# Patient Record
Sex: Female | Born: 1935 | Hispanic: No | Marital: Single | Smoking: Never smoker
Health system: Southern US, Community
[De-identification: ages and names within clinical notes are randomized; demographics above are authoritative.]

## PROBLEM LIST (undated history)

## (undated) DIAGNOSIS — H3530 Unspecified macular degeneration: Secondary | ICD-10-CM

## (undated) DIAGNOSIS — I4891 Unspecified atrial fibrillation: Secondary | ICD-10-CM

## (undated) DIAGNOSIS — C801 Malignant (primary) neoplasm, unspecified: Secondary | ICD-10-CM

## (undated) DIAGNOSIS — I10 Essential (primary) hypertension: Secondary | ICD-10-CM

## (undated) HISTORY — PX: CATARACT EXTRACTION W/PHACO: SHX586

## (undated) HISTORY — DX: Unspecified atrial fibrillation: I48.91

## (undated) HISTORY — DX: Essential (primary) hypertension: I10

## (undated) HISTORY — DX: Unspecified macular degeneration: H35.30

## (undated) HISTORY — DX: Malignant (primary) neoplasm, unspecified: C80.1

---

## 2000-12-30 ENCOUNTER — Other Ambulatory Visit: Admission: RE | Admit: 2000-12-30 | Discharge: 2000-12-30 | Payer: Self-pay | Admitting: Dermatology

## 2015-05-09 DIAGNOSIS — C44219 Basal cell carcinoma of skin of left ear and external auricular canal: Secondary | ICD-10-CM | POA: Diagnosis not present

## 2015-05-09 DIAGNOSIS — L570 Actinic keratosis: Secondary | ICD-10-CM | POA: Diagnosis not present

## 2015-05-09 DIAGNOSIS — D485 Neoplasm of uncertain behavior of skin: Secondary | ICD-10-CM | POA: Diagnosis not present

## 2015-05-23 DIAGNOSIS — C44219 Basal cell carcinoma of skin of left ear and external auricular canal: Secondary | ICD-10-CM | POA: Diagnosis not present

## 2015-06-03 DIAGNOSIS — H353211 Exudative age-related macular degeneration, right eye, with active choroidal neovascularization: Secondary | ICD-10-CM | POA: Diagnosis not present

## 2015-07-04 DIAGNOSIS — Z6826 Body mass index (BMI) 26.0-26.9, adult: Secondary | ICD-10-CM | POA: Diagnosis not present

## 2015-07-04 DIAGNOSIS — M109 Gout, unspecified: Secondary | ICD-10-CM | POA: Diagnosis not present

## 2015-07-04 DIAGNOSIS — E039 Hypothyroidism, unspecified: Secondary | ICD-10-CM | POA: Diagnosis not present

## 2015-07-04 DIAGNOSIS — M1990 Unspecified osteoarthritis, unspecified site: Secondary | ICD-10-CM | POA: Diagnosis not present

## 2015-07-04 DIAGNOSIS — I10 Essential (primary) hypertension: Secondary | ICD-10-CM | POA: Diagnosis not present

## 2015-07-04 DIAGNOSIS — Z789 Other specified health status: Secondary | ICD-10-CM | POA: Diagnosis not present

## 2015-08-06 DIAGNOSIS — H353211 Exudative age-related macular degeneration, right eye, with active choroidal neovascularization: Secondary | ICD-10-CM | POA: Diagnosis not present

## 2015-08-06 DIAGNOSIS — H353221 Exudative age-related macular degeneration, left eye, with active choroidal neovascularization: Secondary | ICD-10-CM | POA: Diagnosis not present

## 2015-10-03 DIAGNOSIS — H353221 Exudative age-related macular degeneration, left eye, with active choroidal neovascularization: Secondary | ICD-10-CM | POA: Diagnosis not present

## 2015-10-03 DIAGNOSIS — H353211 Exudative age-related macular degeneration, right eye, with active choroidal neovascularization: Secondary | ICD-10-CM | POA: Diagnosis not present

## 2015-10-22 DIAGNOSIS — Z85828 Personal history of other malignant neoplasm of skin: Secondary | ICD-10-CM | POA: Diagnosis not present

## 2015-10-22 DIAGNOSIS — D485 Neoplasm of uncertain behavior of skin: Secondary | ICD-10-CM | POA: Diagnosis not present

## 2015-10-22 DIAGNOSIS — L820 Inflamed seborrheic keratosis: Secondary | ICD-10-CM | POA: Diagnosis not present

## 2015-10-22 DIAGNOSIS — D0439 Carcinoma in situ of skin of other parts of face: Secondary | ICD-10-CM | POA: Diagnosis not present

## 2015-10-22 DIAGNOSIS — L570 Actinic keratosis: Secondary | ICD-10-CM | POA: Diagnosis not present

## 2015-11-06 DIAGNOSIS — E7800 Pure hypercholesterolemia, unspecified: Secondary | ICD-10-CM | POA: Diagnosis not present

## 2015-11-06 DIAGNOSIS — M109 Gout, unspecified: Secondary | ICD-10-CM | POA: Diagnosis not present

## 2015-11-06 DIAGNOSIS — Z299 Encounter for prophylactic measures, unspecified: Secondary | ICD-10-CM | POA: Diagnosis not present

## 2015-11-06 DIAGNOSIS — I10 Essential (primary) hypertension: Secondary | ICD-10-CM | POA: Diagnosis not present

## 2015-11-28 DIAGNOSIS — C44129 Squamous cell carcinoma of skin of left eyelid, including canthus: Secondary | ICD-10-CM | POA: Diagnosis not present

## 2015-12-05 DIAGNOSIS — H353211 Exudative age-related macular degeneration, right eye, with active choroidal neovascularization: Secondary | ICD-10-CM | POA: Diagnosis not present

## 2015-12-05 DIAGNOSIS — H35051 Retinal neovascularization, unspecified, right eye: Secondary | ICD-10-CM | POA: Diagnosis not present

## 2015-12-17 DIAGNOSIS — M159 Polyosteoarthritis, unspecified: Secondary | ICD-10-CM | POA: Diagnosis not present

## 2015-12-17 DIAGNOSIS — I10 Essential (primary) hypertension: Secondary | ICD-10-CM | POA: Diagnosis not present

## 2016-01-15 DIAGNOSIS — M159 Polyosteoarthritis, unspecified: Secondary | ICD-10-CM | POA: Diagnosis not present

## 2016-01-15 DIAGNOSIS — I10 Essential (primary) hypertension: Secondary | ICD-10-CM | POA: Diagnosis not present

## 2016-01-21 DIAGNOSIS — E039 Hypothyroidism, unspecified: Secondary | ICD-10-CM | POA: Diagnosis not present

## 2016-01-21 DIAGNOSIS — Z1211 Encounter for screening for malignant neoplasm of colon: Secondary | ICD-10-CM | POA: Diagnosis not present

## 2016-01-21 DIAGNOSIS — E7800 Pure hypercholesterolemia, unspecified: Secondary | ICD-10-CM | POA: Diagnosis not present

## 2016-01-21 DIAGNOSIS — E559 Vitamin D deficiency, unspecified: Secondary | ICD-10-CM | POA: Diagnosis not present

## 2016-01-21 DIAGNOSIS — Z0000 Encounter for general adult medical examination without abnormal findings: Secondary | ICD-10-CM | POA: Diagnosis not present

## 2016-01-21 DIAGNOSIS — R5383 Other fatigue: Secondary | ICD-10-CM | POA: Diagnosis not present

## 2016-01-21 DIAGNOSIS — Z79899 Other long term (current) drug therapy: Secondary | ICD-10-CM | POA: Diagnosis not present

## 2016-01-21 DIAGNOSIS — Z1389 Encounter for screening for other disorder: Secondary | ICD-10-CM | POA: Diagnosis not present

## 2016-01-21 DIAGNOSIS — Z299 Encounter for prophylactic measures, unspecified: Secondary | ICD-10-CM | POA: Diagnosis not present

## 2016-01-21 DIAGNOSIS — Z7189 Other specified counseling: Secondary | ICD-10-CM | POA: Diagnosis not present

## 2016-01-21 DIAGNOSIS — Z6826 Body mass index (BMI) 26.0-26.9, adult: Secondary | ICD-10-CM | POA: Diagnosis not present

## 2016-01-28 DIAGNOSIS — H353211 Exudative age-related macular degeneration, right eye, with active choroidal neovascularization: Secondary | ICD-10-CM | POA: Diagnosis not present

## 2016-02-04 DIAGNOSIS — I10 Essential (primary) hypertension: Secondary | ICD-10-CM | POA: Diagnosis not present

## 2016-02-04 DIAGNOSIS — M159 Polyosteoarthritis, unspecified: Secondary | ICD-10-CM | POA: Diagnosis not present

## 2016-02-25 DIAGNOSIS — Z6826 Body mass index (BMI) 26.0-26.9, adult: Secondary | ICD-10-CM | POA: Diagnosis not present

## 2016-02-25 DIAGNOSIS — Z23 Encounter for immunization: Secondary | ICD-10-CM | POA: Diagnosis not present

## 2016-02-25 DIAGNOSIS — Z713 Dietary counseling and surveillance: Secondary | ICD-10-CM | POA: Diagnosis not present

## 2016-02-25 DIAGNOSIS — J309 Allergic rhinitis, unspecified: Secondary | ICD-10-CM | POA: Diagnosis not present

## 2016-02-25 DIAGNOSIS — Z299 Encounter for prophylactic measures, unspecified: Secondary | ICD-10-CM | POA: Diagnosis not present

## 2016-02-25 DIAGNOSIS — I10 Essential (primary) hypertension: Secondary | ICD-10-CM | POA: Diagnosis not present

## 2016-03-10 DIAGNOSIS — E2839 Other primary ovarian failure: Secondary | ICD-10-CM | POA: Diagnosis not present

## 2016-03-17 DIAGNOSIS — M159 Polyosteoarthritis, unspecified: Secondary | ICD-10-CM | POA: Diagnosis not present

## 2016-03-17 DIAGNOSIS — I10 Essential (primary) hypertension: Secondary | ICD-10-CM | POA: Diagnosis not present

## 2016-03-31 DIAGNOSIS — H353211 Exudative age-related macular degeneration, right eye, with active choroidal neovascularization: Secondary | ICD-10-CM | POA: Diagnosis not present

## 2016-05-12 DIAGNOSIS — M159 Polyosteoarthritis, unspecified: Secondary | ICD-10-CM | POA: Diagnosis not present

## 2016-05-12 DIAGNOSIS — I10 Essential (primary) hypertension: Secondary | ICD-10-CM | POA: Diagnosis not present

## 2016-05-13 DIAGNOSIS — Z85828 Personal history of other malignant neoplasm of skin: Secondary | ICD-10-CM | POA: Diagnosis not present

## 2016-05-13 DIAGNOSIS — L570 Actinic keratosis: Secondary | ICD-10-CM | POA: Diagnosis not present

## 2016-05-13 DIAGNOSIS — D485 Neoplasm of uncertain behavior of skin: Secondary | ICD-10-CM | POA: Diagnosis not present

## 2016-06-02 DIAGNOSIS — H353211 Exudative age-related macular degeneration, right eye, with active choroidal neovascularization: Secondary | ICD-10-CM | POA: Diagnosis not present

## 2016-06-08 DIAGNOSIS — I10 Essential (primary) hypertension: Secondary | ICD-10-CM | POA: Diagnosis not present

## 2016-06-08 DIAGNOSIS — M159 Polyosteoarthritis, unspecified: Secondary | ICD-10-CM | POA: Diagnosis not present

## 2016-06-18 DIAGNOSIS — H353122 Nonexudative age-related macular degeneration, left eye, intermediate dry stage: Secondary | ICD-10-CM | POA: Diagnosis not present

## 2016-06-18 DIAGNOSIS — H35033 Hypertensive retinopathy, bilateral: Secondary | ICD-10-CM | POA: Diagnosis not present

## 2016-06-18 DIAGNOSIS — Z961 Presence of intraocular lens: Secondary | ICD-10-CM | POA: Diagnosis not present

## 2016-06-18 DIAGNOSIS — H353211 Exudative age-related macular degeneration, right eye, with active choroidal neovascularization: Secondary | ICD-10-CM | POA: Diagnosis not present

## 2016-06-30 DIAGNOSIS — R252 Cramp and spasm: Secondary | ICD-10-CM | POA: Diagnosis not present

## 2016-06-30 DIAGNOSIS — Z789 Other specified health status: Secondary | ICD-10-CM | POA: Diagnosis not present

## 2016-06-30 DIAGNOSIS — E7800 Pure hypercholesterolemia, unspecified: Secondary | ICD-10-CM | POA: Diagnosis not present

## 2016-06-30 DIAGNOSIS — Z299 Encounter for prophylactic measures, unspecified: Secondary | ICD-10-CM | POA: Diagnosis not present

## 2016-06-30 DIAGNOSIS — Z6824 Body mass index (BMI) 24.0-24.9, adult: Secondary | ICD-10-CM | POA: Diagnosis not present

## 2016-06-30 DIAGNOSIS — I10 Essential (primary) hypertension: Secondary | ICD-10-CM | POA: Diagnosis not present

## 2016-06-30 DIAGNOSIS — Z713 Dietary counseling and surveillance: Secondary | ICD-10-CM | POA: Diagnosis not present

## 2016-07-01 DIAGNOSIS — E7800 Pure hypercholesterolemia, unspecified: Secondary | ICD-10-CM | POA: Diagnosis not present

## 2016-08-18 DIAGNOSIS — H35362 Drusen (degenerative) of macula, left eye: Secondary | ICD-10-CM | POA: Diagnosis not present

## 2016-08-18 DIAGNOSIS — M159 Polyosteoarthritis, unspecified: Secondary | ICD-10-CM | POA: Diagnosis not present

## 2016-08-18 DIAGNOSIS — H353211 Exudative age-related macular degeneration, right eye, with active choroidal neovascularization: Secondary | ICD-10-CM | POA: Diagnosis not present

## 2016-08-18 DIAGNOSIS — H43812 Vitreous degeneration, left eye: Secondary | ICD-10-CM | POA: Diagnosis not present

## 2016-08-18 DIAGNOSIS — I10 Essential (primary) hypertension: Secondary | ICD-10-CM | POA: Diagnosis not present

## 2016-08-18 DIAGNOSIS — H353221 Exudative age-related macular degeneration, left eye, with active choroidal neovascularization: Secondary | ICD-10-CM | POA: Diagnosis not present

## 2016-08-18 DIAGNOSIS — H35051 Retinal neovascularization, unspecified, right eye: Secondary | ICD-10-CM | POA: Diagnosis not present

## 2016-09-04 DIAGNOSIS — M159 Polyosteoarthritis, unspecified: Secondary | ICD-10-CM | POA: Diagnosis not present

## 2016-09-04 DIAGNOSIS — I10 Essential (primary) hypertension: Secondary | ICD-10-CM | POA: Diagnosis not present

## 2016-09-29 DIAGNOSIS — Z6825 Body mass index (BMI) 25.0-25.9, adult: Secondary | ICD-10-CM | POA: Diagnosis not present

## 2016-09-29 DIAGNOSIS — Z713 Dietary counseling and surveillance: Secondary | ICD-10-CM | POA: Diagnosis not present

## 2016-09-29 DIAGNOSIS — E039 Hypothyroidism, unspecified: Secondary | ICD-10-CM | POA: Diagnosis not present

## 2016-09-29 DIAGNOSIS — Z299 Encounter for prophylactic measures, unspecified: Secondary | ICD-10-CM | POA: Diagnosis not present

## 2016-09-29 DIAGNOSIS — E7800 Pure hypercholesterolemia, unspecified: Secondary | ICD-10-CM | POA: Diagnosis not present

## 2016-09-29 DIAGNOSIS — H3530 Unspecified macular degeneration: Secondary | ICD-10-CM | POA: Diagnosis not present

## 2016-09-29 DIAGNOSIS — M109 Gout, unspecified: Secondary | ICD-10-CM | POA: Diagnosis not present

## 2016-09-29 DIAGNOSIS — I10 Essential (primary) hypertension: Secondary | ICD-10-CM | POA: Diagnosis not present

## 2016-10-13 DIAGNOSIS — H905 Unspecified sensorineural hearing loss: Secondary | ICD-10-CM | POA: Diagnosis not present

## 2016-11-17 DIAGNOSIS — H353122 Nonexudative age-related macular degeneration, left eye, intermediate dry stage: Secondary | ICD-10-CM | POA: Diagnosis not present

## 2016-11-17 DIAGNOSIS — H353113 Nonexudative age-related macular degeneration, right eye, advanced atrophic without subfoveal involvement: Secondary | ICD-10-CM | POA: Diagnosis not present

## 2016-11-17 DIAGNOSIS — H353222 Exudative age-related macular degeneration, left eye, with inactive choroidal neovascularization: Secondary | ICD-10-CM | POA: Diagnosis not present

## 2016-11-17 DIAGNOSIS — H353212 Exudative age-related macular degeneration, right eye, with inactive choroidal neovascularization: Secondary | ICD-10-CM | POA: Diagnosis not present

## 2016-11-18 DIAGNOSIS — L570 Actinic keratosis: Secondary | ICD-10-CM | POA: Diagnosis not present

## 2016-11-18 DIAGNOSIS — Z85828 Personal history of other malignant neoplasm of skin: Secondary | ICD-10-CM | POA: Diagnosis not present

## 2016-11-18 DIAGNOSIS — D485 Neoplasm of uncertain behavior of skin: Secondary | ICD-10-CM | POA: Diagnosis not present

## 2016-12-16 DIAGNOSIS — I10 Essential (primary) hypertension: Secondary | ICD-10-CM | POA: Diagnosis not present

## 2016-12-16 DIAGNOSIS — M159 Polyosteoarthritis, unspecified: Secondary | ICD-10-CM | POA: Diagnosis not present

## 2016-12-23 DIAGNOSIS — H353113 Nonexudative age-related macular degeneration, right eye, advanced atrophic without subfoveal involvement: Secondary | ICD-10-CM | POA: Diagnosis not present

## 2016-12-23 DIAGNOSIS — H353212 Exudative age-related macular degeneration, right eye, with inactive choroidal neovascularization: Secondary | ICD-10-CM | POA: Diagnosis not present

## 2016-12-23 DIAGNOSIS — H353122 Nonexudative age-related macular degeneration, left eye, intermediate dry stage: Secondary | ICD-10-CM | POA: Diagnosis not present

## 2016-12-23 DIAGNOSIS — H35051 Retinal neovascularization, unspecified, right eye: Secondary | ICD-10-CM | POA: Diagnosis not present

## 2016-12-23 DIAGNOSIS — H353222 Exudative age-related macular degeneration, left eye, with inactive choroidal neovascularization: Secondary | ICD-10-CM | POA: Diagnosis not present

## 2017-01-14 DIAGNOSIS — M159 Polyosteoarthritis, unspecified: Secondary | ICD-10-CM | POA: Diagnosis not present

## 2017-01-14 DIAGNOSIS — I10 Essential (primary) hypertension: Secondary | ICD-10-CM | POA: Diagnosis not present

## 2017-02-03 DIAGNOSIS — R5383 Other fatigue: Secondary | ICD-10-CM | POA: Diagnosis not present

## 2017-02-03 DIAGNOSIS — E559 Vitamin D deficiency, unspecified: Secondary | ICD-10-CM | POA: Diagnosis not present

## 2017-02-03 DIAGNOSIS — H3530 Unspecified macular degeneration: Secondary | ICD-10-CM | POA: Diagnosis not present

## 2017-02-03 DIAGNOSIS — E7800 Pure hypercholesterolemia, unspecified: Secondary | ICD-10-CM | POA: Diagnosis not present

## 2017-02-03 DIAGNOSIS — E039 Hypothyroidism, unspecified: Secondary | ICD-10-CM | POA: Diagnosis not present

## 2017-02-03 DIAGNOSIS — Z79899 Other long term (current) drug therapy: Secondary | ICD-10-CM | POA: Diagnosis not present

## 2017-02-03 DIAGNOSIS — I10 Essential (primary) hypertension: Secondary | ICD-10-CM | POA: Diagnosis not present

## 2017-02-03 DIAGNOSIS — Z1339 Encounter for screening examination for other mental health and behavioral disorders: Secondary | ICD-10-CM | POA: Diagnosis not present

## 2017-02-03 DIAGNOSIS — Z6824 Body mass index (BMI) 24.0-24.9, adult: Secondary | ICD-10-CM | POA: Diagnosis not present

## 2017-02-03 DIAGNOSIS — Z299 Encounter for prophylactic measures, unspecified: Secondary | ICD-10-CM | POA: Diagnosis not present

## 2017-02-03 DIAGNOSIS — M109 Gout, unspecified: Secondary | ICD-10-CM | POA: Diagnosis not present

## 2017-02-03 DIAGNOSIS — Z1331 Encounter for screening for depression: Secondary | ICD-10-CM | POA: Diagnosis not present

## 2017-02-03 DIAGNOSIS — Z7189 Other specified counseling: Secondary | ICD-10-CM | POA: Diagnosis not present

## 2017-02-03 DIAGNOSIS — Z0000 Encounter for general adult medical examination without abnormal findings: Secondary | ICD-10-CM | POA: Diagnosis not present

## 2017-02-03 DIAGNOSIS — Z23 Encounter for immunization: Secondary | ICD-10-CM | POA: Diagnosis not present

## 2017-02-03 DIAGNOSIS — Z1211 Encounter for screening for malignant neoplasm of colon: Secondary | ICD-10-CM | POA: Diagnosis not present

## 2017-02-09 DIAGNOSIS — I10 Essential (primary) hypertension: Secondary | ICD-10-CM | POA: Diagnosis not present

## 2017-02-09 DIAGNOSIS — M159 Polyosteoarthritis, unspecified: Secondary | ICD-10-CM | POA: Diagnosis not present

## 2017-02-12 DIAGNOSIS — Z299 Encounter for prophylactic measures, unspecified: Secondary | ICD-10-CM | POA: Diagnosis not present

## 2017-02-12 DIAGNOSIS — H6123 Impacted cerumen, bilateral: Secondary | ICD-10-CM | POA: Diagnosis not present

## 2017-02-12 DIAGNOSIS — Z6824 Body mass index (BMI) 24.0-24.9, adult: Secondary | ICD-10-CM | POA: Diagnosis not present

## 2017-02-24 DIAGNOSIS — H353212 Exudative age-related macular degeneration, right eye, with inactive choroidal neovascularization: Secondary | ICD-10-CM | POA: Diagnosis not present

## 2017-02-24 DIAGNOSIS — H353113 Nonexudative age-related macular degeneration, right eye, advanced atrophic without subfoveal involvement: Secondary | ICD-10-CM | POA: Diagnosis not present

## 2017-02-24 DIAGNOSIS — H353222 Exudative age-related macular degeneration, left eye, with inactive choroidal neovascularization: Secondary | ICD-10-CM | POA: Diagnosis not present

## 2017-02-24 DIAGNOSIS — H35051 Retinal neovascularization, unspecified, right eye: Secondary | ICD-10-CM | POA: Diagnosis not present

## 2017-05-18 DIAGNOSIS — Z789 Other specified health status: Secondary | ICD-10-CM | POA: Diagnosis not present

## 2017-05-18 DIAGNOSIS — J069 Acute upper respiratory infection, unspecified: Secondary | ICD-10-CM | POA: Diagnosis not present

## 2017-05-18 DIAGNOSIS — H9193 Unspecified hearing loss, bilateral: Secondary | ICD-10-CM | POA: Diagnosis not present

## 2017-05-18 DIAGNOSIS — Z6823 Body mass index (BMI) 23.0-23.9, adult: Secondary | ICD-10-CM | POA: Diagnosis not present

## 2017-05-18 DIAGNOSIS — I10 Essential (primary) hypertension: Secondary | ICD-10-CM | POA: Diagnosis not present

## 2017-05-18 DIAGNOSIS — N184 Chronic kidney disease, stage 4 (severe): Secondary | ICD-10-CM | POA: Diagnosis not present

## 2017-05-18 DIAGNOSIS — Z299 Encounter for prophylactic measures, unspecified: Secondary | ICD-10-CM | POA: Diagnosis not present

## 2017-06-01 DIAGNOSIS — L570 Actinic keratosis: Secondary | ICD-10-CM | POA: Diagnosis not present

## 2017-06-01 DIAGNOSIS — Z85828 Personal history of other malignant neoplasm of skin: Secondary | ICD-10-CM | POA: Diagnosis not present

## 2017-06-30 DIAGNOSIS — H35033 Hypertensive retinopathy, bilateral: Secondary | ICD-10-CM | POA: Diagnosis not present

## 2017-06-30 DIAGNOSIS — H353211 Exudative age-related macular degeneration, right eye, with active choroidal neovascularization: Secondary | ICD-10-CM | POA: Diagnosis not present

## 2017-06-30 DIAGNOSIS — Z961 Presence of intraocular lens: Secondary | ICD-10-CM | POA: Diagnosis not present

## 2017-06-30 DIAGNOSIS — H353122 Nonexudative age-related macular degeneration, left eye, intermediate dry stage: Secondary | ICD-10-CM | POA: Diagnosis not present

## 2017-07-01 DIAGNOSIS — M159 Polyosteoarthritis, unspecified: Secondary | ICD-10-CM | POA: Diagnosis not present

## 2017-07-01 DIAGNOSIS — I10 Essential (primary) hypertension: Secondary | ICD-10-CM | POA: Diagnosis not present

## 2017-07-28 DIAGNOSIS — I10 Essential (primary) hypertension: Secondary | ICD-10-CM | POA: Diagnosis not present

## 2017-07-28 DIAGNOSIS — M159 Polyosteoarthritis, unspecified: Secondary | ICD-10-CM | POA: Diagnosis not present

## 2017-08-05 DIAGNOSIS — Z299 Encounter for prophylactic measures, unspecified: Secondary | ICD-10-CM | POA: Diagnosis not present

## 2017-08-05 DIAGNOSIS — I10 Essential (primary) hypertension: Secondary | ICD-10-CM | POA: Diagnosis not present

## 2017-08-05 DIAGNOSIS — Z6823 Body mass index (BMI) 23.0-23.9, adult: Secondary | ICD-10-CM | POA: Diagnosis not present

## 2017-08-05 DIAGNOSIS — N184 Chronic kidney disease, stage 4 (severe): Secondary | ICD-10-CM | POA: Diagnosis not present

## 2017-08-05 DIAGNOSIS — Z713 Dietary counseling and surveillance: Secondary | ICD-10-CM | POA: Diagnosis not present

## 2017-08-18 DIAGNOSIS — H353113 Nonexudative age-related macular degeneration, right eye, advanced atrophic without subfoveal involvement: Secondary | ICD-10-CM | POA: Diagnosis not present

## 2017-08-18 DIAGNOSIS — H35051 Retinal neovascularization, unspecified, right eye: Secondary | ICD-10-CM | POA: Diagnosis not present

## 2017-08-18 DIAGNOSIS — H353222 Exudative age-related macular degeneration, left eye, with inactive choroidal neovascularization: Secondary | ICD-10-CM | POA: Diagnosis not present

## 2017-08-18 DIAGNOSIS — H353212 Exudative age-related macular degeneration, right eye, with inactive choroidal neovascularization: Secondary | ICD-10-CM | POA: Diagnosis not present

## 2017-08-19 DIAGNOSIS — Z6822 Body mass index (BMI) 22.0-22.9, adult: Secondary | ICD-10-CM | POA: Diagnosis not present

## 2017-08-19 DIAGNOSIS — Z789 Other specified health status: Secondary | ICD-10-CM | POA: Diagnosis not present

## 2017-08-19 DIAGNOSIS — N184 Chronic kidney disease, stage 4 (severe): Secondary | ICD-10-CM | POA: Diagnosis not present

## 2017-08-19 DIAGNOSIS — I10 Essential (primary) hypertension: Secondary | ICD-10-CM | POA: Diagnosis not present

## 2017-08-19 DIAGNOSIS — E7800 Pure hypercholesterolemia, unspecified: Secondary | ICD-10-CM | POA: Diagnosis not present

## 2017-08-19 DIAGNOSIS — E039 Hypothyroidism, unspecified: Secondary | ICD-10-CM | POA: Diagnosis not present

## 2017-08-19 DIAGNOSIS — H9193 Unspecified hearing loss, bilateral: Secondary | ICD-10-CM | POA: Diagnosis not present

## 2017-08-19 DIAGNOSIS — Z299 Encounter for prophylactic measures, unspecified: Secondary | ICD-10-CM | POA: Diagnosis not present

## 2017-08-23 DIAGNOSIS — I10 Essential (primary) hypertension: Secondary | ICD-10-CM | POA: Diagnosis not present

## 2017-08-23 DIAGNOSIS — M159 Polyosteoarthritis, unspecified: Secondary | ICD-10-CM | POA: Diagnosis not present

## 2017-09-06 DIAGNOSIS — N2581 Secondary hyperparathyroidism of renal origin: Secondary | ICD-10-CM | POA: Diagnosis not present

## 2017-09-06 DIAGNOSIS — M109 Gout, unspecified: Secondary | ICD-10-CM | POA: Diagnosis not present

## 2017-09-06 DIAGNOSIS — I129 Hypertensive chronic kidney disease with stage 1 through stage 4 chronic kidney disease, or unspecified chronic kidney disease: Secondary | ICD-10-CM | POA: Diagnosis not present

## 2017-09-06 DIAGNOSIS — N183 Chronic kidney disease, stage 3 (moderate): Secondary | ICD-10-CM | POA: Diagnosis not present

## 2017-09-06 DIAGNOSIS — D631 Anemia in chronic kidney disease: Secondary | ICD-10-CM | POA: Diagnosis not present

## 2017-09-09 DIAGNOSIS — M109 Gout, unspecified: Secondary | ICD-10-CM | POA: Diagnosis not present

## 2017-09-09 DIAGNOSIS — I10 Essential (primary) hypertension: Secondary | ICD-10-CM | POA: Diagnosis not present

## 2017-09-09 DIAGNOSIS — Z6822 Body mass index (BMI) 22.0-22.9, adult: Secondary | ICD-10-CM | POA: Diagnosis not present

## 2017-09-09 DIAGNOSIS — N184 Chronic kidney disease, stage 4 (severe): Secondary | ICD-10-CM | POA: Diagnosis not present

## 2017-09-09 DIAGNOSIS — E7800 Pure hypercholesterolemia, unspecified: Secondary | ICD-10-CM | POA: Diagnosis not present

## 2017-09-09 DIAGNOSIS — Z299 Encounter for prophylactic measures, unspecified: Secondary | ICD-10-CM | POA: Diagnosis not present

## 2017-09-13 ENCOUNTER — Other Ambulatory Visit: Payer: Self-pay | Admitting: Nephrology

## 2017-09-13 DIAGNOSIS — N183 Chronic kidney disease, stage 3 unspecified: Secondary | ICD-10-CM

## 2017-09-13 DIAGNOSIS — I129 Hypertensive chronic kidney disease with stage 1 through stage 4 chronic kidney disease, or unspecified chronic kidney disease: Secondary | ICD-10-CM

## 2017-09-16 ENCOUNTER — Ambulatory Visit
Admission: RE | Admit: 2017-09-16 | Discharge: 2017-09-16 | Disposition: A | Discharge status: Home / Self Care | Payer: Medicare Other | Source: Ambulatory Visit | Attending: Nephrology | Admitting: Nephrology

## 2017-09-16 DIAGNOSIS — N183 Chronic kidney disease, stage 3 unspecified: Secondary | ICD-10-CM

## 2017-09-16 DIAGNOSIS — I129 Hypertensive chronic kidney disease with stage 1 through stage 4 chronic kidney disease, or unspecified chronic kidney disease: Secondary | ICD-10-CM

## 2017-09-16 DIAGNOSIS — N189 Chronic kidney disease, unspecified: Secondary | ICD-10-CM | POA: Diagnosis not present

## 2017-09-16 DIAGNOSIS — N281 Cyst of kidney, acquired: Secondary | ICD-10-CM | POA: Diagnosis not present

## 2017-10-07 DIAGNOSIS — Z7982 Long term (current) use of aspirin: Secondary | ICD-10-CM | POA: Diagnosis not present

## 2017-10-07 DIAGNOSIS — E7800 Pure hypercholesterolemia, unspecified: Secondary | ICD-10-CM | POA: Diagnosis not present

## 2017-10-07 DIAGNOSIS — I10 Essential (primary) hypertension: Secondary | ICD-10-CM | POA: Diagnosis not present

## 2017-10-07 DIAGNOSIS — S42211A Unspecified displaced fracture of surgical neck of right humerus, initial encounter for closed fracture: Secondary | ICD-10-CM | POA: Diagnosis not present

## 2017-10-07 DIAGNOSIS — W07XXXA Fall from chair, initial encounter: Secondary | ICD-10-CM | POA: Diagnosis not present

## 2017-10-07 DIAGNOSIS — Z79899 Other long term (current) drug therapy: Secondary | ICD-10-CM | POA: Diagnosis not present

## 2017-10-08 DIAGNOSIS — S42211A Unspecified displaced fracture of surgical neck of right humerus, initial encounter for closed fracture: Secondary | ICD-10-CM | POA: Diagnosis not present

## 2017-10-08 DIAGNOSIS — S42301D Unspecified fracture of shaft of humerus, right arm, subsequent encounter for fracture with routine healing: Secondary | ICD-10-CM | POA: Diagnosis not present

## 2017-10-19 DIAGNOSIS — N183 Chronic kidney disease, stage 3 (moderate): Secondary | ICD-10-CM | POA: Diagnosis not present

## 2017-10-19 DIAGNOSIS — R350 Frequency of micturition: Secondary | ICD-10-CM | POA: Diagnosis not present

## 2017-10-19 DIAGNOSIS — R8271 Bacteriuria: Secondary | ICD-10-CM | POA: Diagnosis not present

## 2017-10-19 DIAGNOSIS — D4101 Neoplasm of uncertain behavior of right kidney: Secondary | ICD-10-CM | POA: Diagnosis not present

## 2017-10-19 DIAGNOSIS — R3915 Urgency of urination: Secondary | ICD-10-CM | POA: Diagnosis not present

## 2017-10-21 DIAGNOSIS — N2889 Other specified disorders of kidney and ureter: Secondary | ICD-10-CM | POA: Diagnosis not present

## 2017-10-21 DIAGNOSIS — R600 Localized edema: Secondary | ICD-10-CM | POA: Diagnosis not present

## 2017-10-21 DIAGNOSIS — E039 Hypothyroidism, unspecified: Secondary | ICD-10-CM | POA: Diagnosis not present

## 2017-10-21 DIAGNOSIS — Z6823 Body mass index (BMI) 23.0-23.9, adult: Secondary | ICD-10-CM | POA: Diagnosis not present

## 2017-10-21 DIAGNOSIS — Z299 Encounter for prophylactic measures, unspecified: Secondary | ICD-10-CM | POA: Diagnosis not present

## 2017-10-21 DIAGNOSIS — I10 Essential (primary) hypertension: Secondary | ICD-10-CM | POA: Diagnosis not present

## 2017-10-29 DIAGNOSIS — S42301D Unspecified fracture of shaft of humerus, right arm, subsequent encounter for fracture with routine healing: Secondary | ICD-10-CM | POA: Diagnosis not present

## 2017-11-03 DIAGNOSIS — M25611 Stiffness of right shoulder, not elsewhere classified: Secondary | ICD-10-CM | POA: Diagnosis not present

## 2017-11-03 DIAGNOSIS — S42201A Unspecified fracture of upper end of right humerus, initial encounter for closed fracture: Secondary | ICD-10-CM | POA: Diagnosis not present

## 2017-11-03 DIAGNOSIS — R29898 Other symptoms and signs involving the musculoskeletal system: Secondary | ICD-10-CM | POA: Diagnosis not present

## 2017-11-09 DIAGNOSIS — R29898 Other symptoms and signs involving the musculoskeletal system: Secondary | ICD-10-CM | POA: Diagnosis not present

## 2017-11-09 DIAGNOSIS — S42201A Unspecified fracture of upper end of right humerus, initial encounter for closed fracture: Secondary | ICD-10-CM | POA: Diagnosis not present

## 2017-11-09 DIAGNOSIS — M25611 Stiffness of right shoulder, not elsewhere classified: Secondary | ICD-10-CM | POA: Diagnosis not present

## 2017-11-11 DIAGNOSIS — S42201A Unspecified fracture of upper end of right humerus, initial encounter for closed fracture: Secondary | ICD-10-CM | POA: Diagnosis not present

## 2017-11-11 DIAGNOSIS — R29898 Other symptoms and signs involving the musculoskeletal system: Secondary | ICD-10-CM | POA: Diagnosis not present

## 2017-11-11 DIAGNOSIS — M25611 Stiffness of right shoulder, not elsewhere classified: Secondary | ICD-10-CM | POA: Diagnosis not present

## 2017-11-16 DIAGNOSIS — R29898 Other symptoms and signs involving the musculoskeletal system: Secondary | ICD-10-CM | POA: Diagnosis not present

## 2017-11-16 DIAGNOSIS — M25611 Stiffness of right shoulder, not elsewhere classified: Secondary | ICD-10-CM | POA: Diagnosis not present

## 2017-11-16 DIAGNOSIS — S42201A Unspecified fracture of upper end of right humerus, initial encounter for closed fracture: Secondary | ICD-10-CM | POA: Diagnosis not present

## 2017-11-18 DIAGNOSIS — M25611 Stiffness of right shoulder, not elsewhere classified: Secondary | ICD-10-CM | POA: Diagnosis not present

## 2017-11-18 DIAGNOSIS — Z299 Encounter for prophylactic measures, unspecified: Secondary | ICD-10-CM | POA: Diagnosis not present

## 2017-11-18 DIAGNOSIS — N184 Chronic kidney disease, stage 4 (severe): Secondary | ICD-10-CM | POA: Diagnosis not present

## 2017-11-18 DIAGNOSIS — R29898 Other symptoms and signs involving the musculoskeletal system: Secondary | ICD-10-CM | POA: Diagnosis not present

## 2017-11-18 DIAGNOSIS — S42201A Unspecified fracture of upper end of right humerus, initial encounter for closed fracture: Secondary | ICD-10-CM | POA: Diagnosis not present

## 2017-11-18 DIAGNOSIS — I10 Essential (primary) hypertension: Secondary | ICD-10-CM | POA: Diagnosis not present

## 2017-11-18 DIAGNOSIS — Z6823 Body mass index (BMI) 23.0-23.9, adult: Secondary | ICD-10-CM | POA: Diagnosis not present

## 2017-11-18 DIAGNOSIS — E039 Hypothyroidism, unspecified: Secondary | ICD-10-CM | POA: Diagnosis not present

## 2017-11-18 DIAGNOSIS — N2581 Secondary hyperparathyroidism of renal origin: Secondary | ICD-10-CM | POA: Diagnosis not present

## 2017-11-23 DIAGNOSIS — M25611 Stiffness of right shoulder, not elsewhere classified: Secondary | ICD-10-CM | POA: Diagnosis not present

## 2017-11-23 DIAGNOSIS — R29898 Other symptoms and signs involving the musculoskeletal system: Secondary | ICD-10-CM | POA: Diagnosis not present

## 2017-11-23 DIAGNOSIS — S42201A Unspecified fracture of upper end of right humerus, initial encounter for closed fracture: Secondary | ICD-10-CM | POA: Diagnosis not present

## 2017-11-25 DIAGNOSIS — R29898 Other symptoms and signs involving the musculoskeletal system: Secondary | ICD-10-CM | POA: Diagnosis not present

## 2017-11-25 DIAGNOSIS — S42201A Unspecified fracture of upper end of right humerus, initial encounter for closed fracture: Secondary | ICD-10-CM | POA: Diagnosis not present

## 2017-11-25 DIAGNOSIS — M25611 Stiffness of right shoulder, not elsewhere classified: Secondary | ICD-10-CM | POA: Diagnosis not present

## 2017-11-30 DIAGNOSIS — C44729 Squamous cell carcinoma of skin of left lower limb, including hip: Secondary | ICD-10-CM | POA: Diagnosis not present

## 2017-11-30 DIAGNOSIS — Z85828 Personal history of other malignant neoplasm of skin: Secondary | ICD-10-CM | POA: Diagnosis not present

## 2017-11-30 DIAGNOSIS — D485 Neoplasm of uncertain behavior of skin: Secondary | ICD-10-CM | POA: Diagnosis not present

## 2017-11-30 DIAGNOSIS — M25611 Stiffness of right shoulder, not elsewhere classified: Secondary | ICD-10-CM | POA: Diagnosis not present

## 2017-11-30 DIAGNOSIS — L570 Actinic keratosis: Secondary | ICD-10-CM | POA: Diagnosis not present

## 2017-11-30 DIAGNOSIS — R29898 Other symptoms and signs involving the musculoskeletal system: Secondary | ICD-10-CM | POA: Diagnosis not present

## 2017-11-30 DIAGNOSIS — C44329 Squamous cell carcinoma of skin of other parts of face: Secondary | ICD-10-CM | POA: Diagnosis not present

## 2017-11-30 DIAGNOSIS — C44121 Squamous cell carcinoma of skin of unspecified eyelid, including canthus: Secondary | ICD-10-CM | POA: Diagnosis not present

## 2017-11-30 DIAGNOSIS — C4442 Squamous cell carcinoma of skin of scalp and neck: Secondary | ICD-10-CM | POA: Diagnosis not present

## 2017-11-30 DIAGNOSIS — L821 Other seborrheic keratosis: Secondary | ICD-10-CM | POA: Diagnosis not present

## 2017-11-30 DIAGNOSIS — S42201A Unspecified fracture of upper end of right humerus, initial encounter for closed fracture: Secondary | ICD-10-CM | POA: Diagnosis not present

## 2017-12-03 DIAGNOSIS — S42301D Unspecified fracture of shaft of humerus, right arm, subsequent encounter for fracture with routine healing: Secondary | ICD-10-CM | POA: Diagnosis not present

## 2017-12-07 DIAGNOSIS — S42201A Unspecified fracture of upper end of right humerus, initial encounter for closed fracture: Secondary | ICD-10-CM | POA: Diagnosis not present

## 2017-12-07 DIAGNOSIS — R29898 Other symptoms and signs involving the musculoskeletal system: Secondary | ICD-10-CM | POA: Diagnosis not present

## 2017-12-07 DIAGNOSIS — M25611 Stiffness of right shoulder, not elsewhere classified: Secondary | ICD-10-CM | POA: Diagnosis not present

## 2017-12-09 DIAGNOSIS — R29898 Other symptoms and signs involving the musculoskeletal system: Secondary | ICD-10-CM | POA: Diagnosis not present

## 2017-12-09 DIAGNOSIS — S42201A Unspecified fracture of upper end of right humerus, initial encounter for closed fracture: Secondary | ICD-10-CM | POA: Diagnosis not present

## 2017-12-09 DIAGNOSIS — M25611 Stiffness of right shoulder, not elsewhere classified: Secondary | ICD-10-CM | POA: Diagnosis not present

## 2017-12-14 DIAGNOSIS — M25611 Stiffness of right shoulder, not elsewhere classified: Secondary | ICD-10-CM | POA: Diagnosis not present

## 2017-12-14 DIAGNOSIS — R29898 Other symptoms and signs involving the musculoskeletal system: Secondary | ICD-10-CM | POA: Diagnosis not present

## 2017-12-14 DIAGNOSIS — S42201A Unspecified fracture of upper end of right humerus, initial encounter for closed fracture: Secondary | ICD-10-CM | POA: Diagnosis not present

## 2017-12-16 DIAGNOSIS — M25611 Stiffness of right shoulder, not elsewhere classified: Secondary | ICD-10-CM | POA: Diagnosis not present

## 2017-12-16 DIAGNOSIS — R29898 Other symptoms and signs involving the musculoskeletal system: Secondary | ICD-10-CM | POA: Diagnosis not present

## 2017-12-16 DIAGNOSIS — S42201A Unspecified fracture of upper end of right humerus, initial encounter for closed fracture: Secondary | ICD-10-CM | POA: Diagnosis not present

## 2017-12-20 DIAGNOSIS — D631 Anemia in chronic kidney disease: Secondary | ICD-10-CM | POA: Diagnosis not present

## 2017-12-20 DIAGNOSIS — N183 Chronic kidney disease, stage 3 (moderate): Secondary | ICD-10-CM | POA: Diagnosis not present

## 2017-12-20 DIAGNOSIS — N2889 Other specified disorders of kidney and ureter: Secondary | ICD-10-CM | POA: Diagnosis not present

## 2017-12-20 DIAGNOSIS — N2581 Secondary hyperparathyroidism of renal origin: Secondary | ICD-10-CM | POA: Diagnosis not present

## 2017-12-20 DIAGNOSIS — I129 Hypertensive chronic kidney disease with stage 1 through stage 4 chronic kidney disease, or unspecified chronic kidney disease: Secondary | ICD-10-CM | POA: Diagnosis not present

## 2017-12-21 DIAGNOSIS — R29898 Other symptoms and signs involving the musculoskeletal system: Secondary | ICD-10-CM | POA: Diagnosis not present

## 2017-12-21 DIAGNOSIS — M25611 Stiffness of right shoulder, not elsewhere classified: Secondary | ICD-10-CM | POA: Diagnosis not present

## 2017-12-21 DIAGNOSIS — S42201A Unspecified fracture of upper end of right humerus, initial encounter for closed fracture: Secondary | ICD-10-CM | POA: Diagnosis not present

## 2017-12-23 DIAGNOSIS — M25611 Stiffness of right shoulder, not elsewhere classified: Secondary | ICD-10-CM | POA: Diagnosis not present

## 2017-12-23 DIAGNOSIS — S42201A Unspecified fracture of upper end of right humerus, initial encounter for closed fracture: Secondary | ICD-10-CM | POA: Diagnosis not present

## 2017-12-23 DIAGNOSIS — R29898 Other symptoms and signs involving the musculoskeletal system: Secondary | ICD-10-CM | POA: Diagnosis not present

## 2017-12-30 DIAGNOSIS — C44329 Squamous cell carcinoma of skin of other parts of face: Secondary | ICD-10-CM | POA: Diagnosis not present

## 2018-01-13 DIAGNOSIS — I10 Essential (primary) hypertension: Secondary | ICD-10-CM | POA: Diagnosis not present

## 2018-01-13 DIAGNOSIS — M159 Polyosteoarthritis, unspecified: Secondary | ICD-10-CM | POA: Diagnosis not present

## 2018-01-13 DIAGNOSIS — C44729 Squamous cell carcinoma of skin of left lower limb, including hip: Secondary | ICD-10-CM | POA: Diagnosis not present

## 2018-01-13 DIAGNOSIS — C4442 Squamous cell carcinoma of skin of scalp and neck: Secondary | ICD-10-CM | POA: Diagnosis not present

## 2018-02-09 DIAGNOSIS — Z23 Encounter for immunization: Secondary | ICD-10-CM | POA: Diagnosis not present

## 2018-02-09 DIAGNOSIS — R5383 Other fatigue: Secondary | ICD-10-CM | POA: Diagnosis not present

## 2018-02-09 DIAGNOSIS — Z6823 Body mass index (BMI) 23.0-23.9, adult: Secondary | ICD-10-CM | POA: Diagnosis not present

## 2018-02-09 DIAGNOSIS — E559 Vitamin D deficiency, unspecified: Secondary | ICD-10-CM | POA: Diagnosis not present

## 2018-02-09 DIAGNOSIS — Z1211 Encounter for screening for malignant neoplasm of colon: Secondary | ICD-10-CM | POA: Diagnosis not present

## 2018-02-09 DIAGNOSIS — Z299 Encounter for prophylactic measures, unspecified: Secondary | ICD-10-CM | POA: Diagnosis not present

## 2018-02-09 DIAGNOSIS — E039 Hypothyroidism, unspecified: Secondary | ICD-10-CM | POA: Diagnosis not present

## 2018-02-09 DIAGNOSIS — E7800 Pure hypercholesterolemia, unspecified: Secondary | ICD-10-CM | POA: Diagnosis not present

## 2018-02-09 DIAGNOSIS — Z79899 Other long term (current) drug therapy: Secondary | ICD-10-CM | POA: Diagnosis not present

## 2018-02-09 DIAGNOSIS — Z1339 Encounter for screening examination for other mental health and behavioral disorders: Secondary | ICD-10-CM | POA: Diagnosis not present

## 2018-02-09 DIAGNOSIS — Z0000 Encounter for general adult medical examination without abnormal findings: Secondary | ICD-10-CM | POA: Diagnosis not present

## 2018-02-09 DIAGNOSIS — Z1331 Encounter for screening for depression: Secondary | ICD-10-CM | POA: Diagnosis not present

## 2018-02-09 DIAGNOSIS — Z7189 Other specified counseling: Secondary | ICD-10-CM | POA: Diagnosis not present

## 2018-02-23 DIAGNOSIS — H35051 Retinal neovascularization, unspecified, right eye: Secondary | ICD-10-CM | POA: Diagnosis not present

## 2018-02-23 DIAGNOSIS — H43811 Vitreous degeneration, right eye: Secondary | ICD-10-CM | POA: Diagnosis not present

## 2018-02-23 DIAGNOSIS — H353222 Exudative age-related macular degeneration, left eye, with inactive choroidal neovascularization: Secondary | ICD-10-CM | POA: Diagnosis not present

## 2018-02-23 DIAGNOSIS — H353212 Exudative age-related macular degeneration, right eye, with inactive choroidal neovascularization: Secondary | ICD-10-CM | POA: Diagnosis not present

## 2018-02-23 DIAGNOSIS — H353113 Nonexudative age-related macular degeneration, right eye, advanced atrophic without subfoveal involvement: Secondary | ICD-10-CM | POA: Diagnosis not present

## 2018-03-29 DIAGNOSIS — E2839 Other primary ovarian failure: Secondary | ICD-10-CM | POA: Diagnosis not present

## 2018-04-11 DIAGNOSIS — N183 Chronic kidney disease, stage 3 (moderate): Secondary | ICD-10-CM | POA: Diagnosis not present

## 2018-04-18 ENCOUNTER — Other Ambulatory Visit: Payer: Self-pay | Admitting: Urology

## 2018-04-18 DIAGNOSIS — D4101 Neoplasm of uncertain behavior of right kidney: Secondary | ICD-10-CM

## 2018-04-18 DIAGNOSIS — N2889 Other specified disorders of kidney and ureter: Secondary | ICD-10-CM | POA: Diagnosis not present

## 2018-04-18 DIAGNOSIS — N183 Chronic kidney disease, stage 3 (moderate): Secondary | ICD-10-CM | POA: Diagnosis not present

## 2018-04-18 DIAGNOSIS — N2581 Secondary hyperparathyroidism of renal origin: Secondary | ICD-10-CM | POA: Diagnosis not present

## 2018-04-18 DIAGNOSIS — D631 Anemia in chronic kidney disease: Secondary | ICD-10-CM | POA: Diagnosis not present

## 2018-04-18 DIAGNOSIS — I129 Hypertensive chronic kidney disease with stage 1 through stage 4 chronic kidney disease, or unspecified chronic kidney disease: Secondary | ICD-10-CM | POA: Diagnosis not present

## 2018-04-22 ENCOUNTER — Ambulatory Visit
Admission: RE | Admit: 2018-04-22 | Discharge: 2018-04-22 | Disposition: A | Discharge status: Home / Self Care | Payer: Medicare Other | Source: Ambulatory Visit | Attending: Urology | Admitting: Urology

## 2018-04-22 DIAGNOSIS — N183 Chronic kidney disease, stage 3 (moderate): Secondary | ICD-10-CM | POA: Diagnosis not present

## 2018-04-22 DIAGNOSIS — D4101 Neoplasm of uncertain behavior of right kidney: Secondary | ICD-10-CM

## 2018-05-11 DIAGNOSIS — M159 Polyosteoarthritis, unspecified: Secondary | ICD-10-CM | POA: Diagnosis not present

## 2018-05-11 DIAGNOSIS — I10 Essential (primary) hypertension: Secondary | ICD-10-CM | POA: Diagnosis not present

## 2018-05-13 DIAGNOSIS — D3501 Benign neoplasm of right adrenal gland: Secondary | ICD-10-CM | POA: Diagnosis not present

## 2018-05-13 DIAGNOSIS — R8271 Bacteriuria: Secondary | ICD-10-CM | POA: Diagnosis not present

## 2018-05-13 DIAGNOSIS — N183 Chronic kidney disease, stage 3 (moderate): Secondary | ICD-10-CM | POA: Diagnosis not present

## 2018-06-01 DIAGNOSIS — L570 Actinic keratosis: Secondary | ICD-10-CM | POA: Diagnosis not present

## 2018-10-12 DIAGNOSIS — I10 Essential (primary) hypertension: Secondary | ICD-10-CM | POA: Diagnosis not present

## 2018-10-12 DIAGNOSIS — Z299 Encounter for prophylactic measures, unspecified: Secondary | ICD-10-CM | POA: Diagnosis not present

## 2018-10-12 DIAGNOSIS — N184 Chronic kidney disease, stage 4 (severe): Secondary | ICD-10-CM | POA: Diagnosis not present

## 2018-10-12 DIAGNOSIS — M109 Gout, unspecified: Secondary | ICD-10-CM | POA: Diagnosis not present

## 2018-10-12 DIAGNOSIS — Z6823 Body mass index (BMI) 23.0-23.9, adult: Secondary | ICD-10-CM | POA: Diagnosis not present

## 2018-11-10 DIAGNOSIS — I10 Essential (primary) hypertension: Secondary | ICD-10-CM | POA: Diagnosis not present

## 2018-11-10 DIAGNOSIS — R5383 Other fatigue: Secondary | ICD-10-CM | POA: Diagnosis not present

## 2018-11-10 DIAGNOSIS — Z299 Encounter for prophylactic measures, unspecified: Secondary | ICD-10-CM | POA: Diagnosis not present

## 2018-11-10 DIAGNOSIS — Z6823 Body mass index (BMI) 23.0-23.9, adult: Secondary | ICD-10-CM | POA: Diagnosis not present

## 2018-11-10 DIAGNOSIS — N2581 Secondary hyperparathyroidism of renal origin: Secondary | ICD-10-CM | POA: Diagnosis not present

## 2018-11-10 DIAGNOSIS — M109 Gout, unspecified: Secondary | ICD-10-CM | POA: Diagnosis not present

## 2018-11-10 DIAGNOSIS — G4710 Hypersomnia, unspecified: Secondary | ICD-10-CM | POA: Diagnosis not present

## 2018-12-06 DIAGNOSIS — H353212 Exudative age-related macular degeneration, right eye, with inactive choroidal neovascularization: Secondary | ICD-10-CM | POA: Diagnosis not present

## 2018-12-06 DIAGNOSIS — H35051 Retinal neovascularization, unspecified, right eye: Secondary | ICD-10-CM | POA: Diagnosis not present

## 2018-12-06 DIAGNOSIS — H353211 Exudative age-related macular degeneration, right eye, with active choroidal neovascularization: Secondary | ICD-10-CM | POA: Diagnosis not present

## 2018-12-06 DIAGNOSIS — H353113 Nonexudative age-related macular degeneration, right eye, advanced atrophic without subfoveal involvement: Secondary | ICD-10-CM | POA: Diagnosis not present

## 2019-01-11 DIAGNOSIS — N189 Chronic kidney disease, unspecified: Secondary | ICD-10-CM | POA: Diagnosis not present

## 2019-01-11 DIAGNOSIS — N183 Chronic kidney disease, stage 3 (moderate): Secondary | ICD-10-CM | POA: Diagnosis not present

## 2019-01-11 DIAGNOSIS — H43811 Vitreous degeneration, right eye: Secondary | ICD-10-CM | POA: Diagnosis not present

## 2019-01-11 DIAGNOSIS — H353211 Exudative age-related macular degeneration, right eye, with active choroidal neovascularization: Secondary | ICD-10-CM | POA: Diagnosis not present

## 2019-01-17 DIAGNOSIS — I129 Hypertensive chronic kidney disease with stage 1 through stage 4 chronic kidney disease, or unspecified chronic kidney disease: Secondary | ICD-10-CM | POA: Diagnosis not present

## 2019-01-17 DIAGNOSIS — D631 Anemia in chronic kidney disease: Secondary | ICD-10-CM | POA: Diagnosis not present

## 2019-01-17 DIAGNOSIS — N183 Chronic kidney disease, stage 3 (moderate): Secondary | ICD-10-CM | POA: Diagnosis not present

## 2019-01-17 DIAGNOSIS — N2581 Secondary hyperparathyroidism of renal origin: Secondary | ICD-10-CM | POA: Diagnosis not present

## 2019-01-17 DIAGNOSIS — R609 Edema, unspecified: Secondary | ICD-10-CM | POA: Diagnosis not present

## 2019-01-17 DIAGNOSIS — N2889 Other specified disorders of kidney and ureter: Secondary | ICD-10-CM | POA: Diagnosis not present

## 2019-02-15 DIAGNOSIS — Z0000 Encounter for general adult medical examination without abnormal findings: Secondary | ICD-10-CM | POA: Diagnosis not present

## 2019-02-15 DIAGNOSIS — Z1339 Encounter for screening examination for other mental health and behavioral disorders: Secondary | ICD-10-CM | POA: Diagnosis not present

## 2019-02-15 DIAGNOSIS — E7800 Pure hypercholesterolemia, unspecified: Secondary | ICD-10-CM | POA: Diagnosis not present

## 2019-02-15 DIAGNOSIS — I10 Essential (primary) hypertension: Secondary | ICD-10-CM | POA: Diagnosis not present

## 2019-02-15 DIAGNOSIS — E559 Vitamin D deficiency, unspecified: Secondary | ICD-10-CM | POA: Diagnosis not present

## 2019-02-15 DIAGNOSIS — N2889 Other specified disorders of kidney and ureter: Secondary | ICD-10-CM | POA: Diagnosis not present

## 2019-02-15 DIAGNOSIS — Z1211 Encounter for screening for malignant neoplasm of colon: Secondary | ICD-10-CM | POA: Diagnosis not present

## 2019-02-15 DIAGNOSIS — Z7189 Other specified counseling: Secondary | ICD-10-CM | POA: Diagnosis not present

## 2019-02-15 DIAGNOSIS — Z6822 Body mass index (BMI) 22.0-22.9, adult: Secondary | ICD-10-CM | POA: Diagnosis not present

## 2019-02-15 DIAGNOSIS — Z299 Encounter for prophylactic measures, unspecified: Secondary | ICD-10-CM | POA: Diagnosis not present

## 2019-02-15 DIAGNOSIS — Z1331 Encounter for screening for depression: Secondary | ICD-10-CM | POA: Diagnosis not present

## 2019-02-22 DIAGNOSIS — H353211 Exudative age-related macular degeneration, right eye, with active choroidal neovascularization: Secondary | ICD-10-CM | POA: Diagnosis not present

## 2019-02-22 DIAGNOSIS — H35051 Retinal neovascularization, unspecified, right eye: Secondary | ICD-10-CM | POA: Diagnosis not present

## 2019-02-22 DIAGNOSIS — H43811 Vitreous degeneration, right eye: Secondary | ICD-10-CM | POA: Diagnosis not present

## 2019-03-20 DIAGNOSIS — Z23 Encounter for immunization: Secondary | ICD-10-CM | POA: Diagnosis not present

## 2019-04-13 DIAGNOSIS — H353222 Exudative age-related macular degeneration, left eye, with inactive choroidal neovascularization: Secondary | ICD-10-CM | POA: Diagnosis not present

## 2019-04-13 DIAGNOSIS — H35051 Retinal neovascularization, unspecified, right eye: Secondary | ICD-10-CM | POA: Diagnosis not present

## 2019-04-13 DIAGNOSIS — H353211 Exudative age-related macular degeneration, right eye, with active choroidal neovascularization: Secondary | ICD-10-CM | POA: Diagnosis not present

## 2019-04-13 DIAGNOSIS — H353113 Nonexudative age-related macular degeneration, right eye, advanced atrophic without subfoveal involvement: Secondary | ICD-10-CM | POA: Diagnosis not present

## 2019-05-23 DIAGNOSIS — Z299 Encounter for prophylactic measures, unspecified: Secondary | ICD-10-CM | POA: Diagnosis not present

## 2019-05-23 DIAGNOSIS — N2581 Secondary hyperparathyroidism of renal origin: Secondary | ICD-10-CM | POA: Diagnosis not present

## 2019-05-23 DIAGNOSIS — N184 Chronic kidney disease, stage 4 (severe): Secondary | ICD-10-CM | POA: Diagnosis not present

## 2019-05-23 DIAGNOSIS — I10 Essential (primary) hypertension: Secondary | ICD-10-CM | POA: Diagnosis not present

## 2019-05-23 DIAGNOSIS — E039 Hypothyroidism, unspecified: Secondary | ICD-10-CM | POA: Diagnosis not present

## 2019-05-23 DIAGNOSIS — Z789 Other specified health status: Secondary | ICD-10-CM | POA: Diagnosis not present

## 2019-05-23 DIAGNOSIS — Z6822 Body mass index (BMI) 22.0-22.9, adult: Secondary | ICD-10-CM | POA: Diagnosis not present

## 2019-06-07 DIAGNOSIS — H353122 Nonexudative age-related macular degeneration, left eye, intermediate dry stage: Secondary | ICD-10-CM | POA: Diagnosis not present

## 2019-06-07 DIAGNOSIS — H353211 Exudative age-related macular degeneration, right eye, with active choroidal neovascularization: Secondary | ICD-10-CM | POA: Diagnosis not present

## 2019-06-07 DIAGNOSIS — H353222 Exudative age-related macular degeneration, left eye, with inactive choroidal neovascularization: Secondary | ICD-10-CM | POA: Diagnosis not present

## 2019-06-25 DIAGNOSIS — I10 Essential (primary) hypertension: Secondary | ICD-10-CM | POA: Diagnosis not present

## 2019-06-28 DIAGNOSIS — M109 Gout, unspecified: Secondary | ICD-10-CM | POA: Diagnosis not present

## 2019-06-28 DIAGNOSIS — I10 Essential (primary) hypertension: Secondary | ICD-10-CM | POA: Diagnosis not present

## 2019-06-28 DIAGNOSIS — Z6822 Body mass index (BMI) 22.0-22.9, adult: Secondary | ICD-10-CM | POA: Diagnosis not present

## 2019-06-28 DIAGNOSIS — Z789 Other specified health status: Secondary | ICD-10-CM | POA: Diagnosis not present

## 2019-06-28 DIAGNOSIS — Z299 Encounter for prophylactic measures, unspecified: Secondary | ICD-10-CM | POA: Diagnosis not present

## 2019-07-19 DIAGNOSIS — H353211 Exudative age-related macular degeneration, right eye, with active choroidal neovascularization: Secondary | ICD-10-CM | POA: Diagnosis not present

## 2019-07-19 DIAGNOSIS — H353222 Exudative age-related macular degeneration, left eye, with inactive choroidal neovascularization: Secondary | ICD-10-CM | POA: Diagnosis not present

## 2019-07-19 DIAGNOSIS — H35051 Retinal neovascularization, unspecified, right eye: Secondary | ICD-10-CM | POA: Diagnosis not present

## 2019-08-01 DIAGNOSIS — I10 Essential (primary) hypertension: Secondary | ICD-10-CM | POA: Diagnosis not present

## 2019-08-28 ENCOUNTER — Ambulatory Visit (INDEPENDENT_AMBULATORY_CARE_PROVIDER_SITE_OTHER): Payer: Medicare Other | Admitting: Ophthalmology

## 2019-08-28 ENCOUNTER — Other Ambulatory Visit: Payer: Self-pay

## 2019-08-28 ENCOUNTER — Encounter (INDEPENDENT_AMBULATORY_CARE_PROVIDER_SITE_OTHER): Payer: Self-pay | Admitting: Ophthalmology

## 2019-08-28 DIAGNOSIS — N189 Chronic kidney disease, unspecified: Secondary | ICD-10-CM | POA: Diagnosis not present

## 2019-08-28 DIAGNOSIS — H353212 Exudative age-related macular degeneration, right eye, with inactive choroidal neovascularization: Secondary | ICD-10-CM

## 2019-08-28 DIAGNOSIS — H353211 Exudative age-related macular degeneration, right eye, with active choroidal neovascularization: Secondary | ICD-10-CM | POA: Diagnosis not present

## 2019-08-28 DIAGNOSIS — H35051 Retinal neovascularization, unspecified, right eye: Secondary | ICD-10-CM

## 2019-08-28 DIAGNOSIS — N183 Chronic kidney disease, stage 3 unspecified: Secondary | ICD-10-CM | POA: Diagnosis not present

## 2019-08-28 MED ORDER — BEVACIZUMAB CHEMO INJECTION 1.25MG/0.05ML SYRINGE FOR KALEIDOSCOPE
1.2500 mg | INTRAVITREAL | Status: AC | PRN
  Administered 2019-08-28: 11:00:00 1.25 mg via INTRAVITREAL

## 2019-08-28 NOTE — Assessment & Plan Note (Signed)
The nature of wet macular degeneration was discussed with the patient.  Forms of therapy reviewed include the use of Anti-VEGF medications injected painlessly into the eye, as well as other possible treatment modalities, including thermal laser therapy. Fellow eye involvement and risks were discussed with the patient. Upon the finding of wet age related macular degeneration, treatment will be offered. The treatment regimen is on a treat as needed basis with the intent to treat if necessary and extend interval of exams when possible. On average 1 out of 6 patients do not need lifetime therapy. However, the risk of recurrent disease is high for a lifetime.  Initially monthly, then periodic, examinations and evaluations will determine whether the next treatment is required on the day of the examination.  OD, on Avastin, at 5-week exam interval with increased CME.  May be Avastin resistant.  Repeat Avastin today and examination in 4 to 5 weeks and likely use Eylea

## 2019-08-28 NOTE — Progress Notes (Signed)
08/28/2019     CHIEF COMPLAINT Patient presents for Retina Follow Up   HISTORY OF PRESENT ILLNESS: Jeanette Cross is a 86 y.o. female who presents to the clinic today for:   HPI    Retina Follow Up    Patient presents with  Wet AMD.  In right eye.  Severity is moderate.  Since onset it is stable.  I, the attending physician,  performed the HPI with the patient and updated documentation appropriately.          Comments    5 Week AMD f\u OD. Possible Avastin OD. OCT  Pt states vision is stable. Pt has not noticed any changes.       Last edited by Tilda Franco on 08/28/2019  9:54 AM. (History)      Referring physician: Monico Blitz, MD [place],  [place] [id number]  HISTORICAL INFORMATION:   Selected notes from the [facility]: No current outpatient medications on file. (Ophthalmic Drugs)   No current facility-administered medications for this visit. (Ophthalmic Drugs)   Current Outpatient Medications (Other)  Medication Sig  . allopurinol (ZYLOPRIM) 100 MG tablet Take 100 mg by mouth daily.  Marland Kitchen levothyroxine (SYNTHROID) 50 MCG tablet Take 50 mcg by mouth daily.  Marland Kitchen lisinopril (ZESTRIL) 2.5 MG tablet Take 2.5 mg by mouth daily.   No current facility-administered medications for this visit. (Other)      REVIEW OF SYSTEMS:    ALLERGIES Allergies  Allergen Reactions  . Novocain [Procaine]   . Tylenol [Acetaminophen]     PAST MEDICAL HISTORY Past Medical History:  Diagnosis Date  . Cancer ([place])   . Hypertension   . Macular degeneration    Past Surgical History:  Procedure Laterality Date  . CATARACT EXTRACTION W/PHACO Bilateral     FAMILY HISTORY Family History  Problem Relation Age of Onset  . Hypertension Sister     SOCIAL HISTORY Social History   Tobacco Use  . Smoking status: Never Smoker  . Smokeless tobacco: Never Used  Substance Use Topics  . Alcohol use: Not on file  . Drug use: Not  on file         OPHTHALMIC EXAM: Base Eye Exam    Visual Acuity (Snellen - Linear)      Right Left   Dist [place] 20/40 20/40   Dist ph [place] NI 20/30 +       Tonometry (Tonopen, 10:02 AM)      Right Left   Pressure 12 10       Pupils      Pupils Dark Light Shape React APD   Right PERRL 2 2 Round Minimal None   Left PERRL 2 2 Round Minimal None       Visual Fields (Counting fingers)      Left Right    Full    Restrictions  Total superior nasal deficiency       Neuro/Psych    Oriented x3: Yes   Mood/Affect: Normal       Dilation    Right eye: 1.0% Mydriacyl, 2.5% Phenylephrine @ 10:02 AM          IMAGING AND PROCEDURES  Imaging and Procedures for 08/28/19           ASSESSMENT/PLAN:  No problem-specific Assessment & Plan notes found for this encounter.      ICD-10-CM   1. Exudative age-related macular degeneration of right eye  with active choroidal neovascularization ([place])  H35.3211 OCT, Retina - OU - Both Eyes  2. Exudative age-related macular degeneration of right eye with inactive choroidal neovascularization (HCC)  H35.3212 OCT, Retina - OU - Both Eyes  3. Choroidal neovascularization of right eye  H35.051     1.OD, on 5-week examination.  With Avastin, with cystoid macular edema and intraretinal edema worse at this interval.  Repeat Avastin OD today, will need to commence with Eylea on next examination.  2.  3.  Ophthalmic Meds Ordered this visit:  No orders of the defined types were placed in this encounter.      No follow-ups on file.  There are no Patient Instructions on file for this visit.   Explained the diagnoses, plan, and follow up with the patient and they expressed understanding.  Patient expressed understanding of the importance of proper follow up care.   Clent Demark Raivyn Kabler M.D. Diseases & Surgery of the Retina and Vitreous Retina & Diabetic [place] 08/28/19     Abbreviations: M myopia (nearsighted); A astigmatism; H  hyperopia (farsighted); P presbyopia; Mrx spectacle prescription;  CTL contact lenses; OD right eye; OS left eye; OU both eyes  XT exotropia; ET esotropia; PEK punctate epithelial keratitis; PEE punctate epithelial erosions; DES dry eye syndrome; MGD meibomian gland dysfunction; ATs artificial tears; PFAT's preservative free artificial tears; [place] nuclear sclerotic cataract; PSC posterior subcapsular cataract; ERM epi-retinal membrane; PVD posterior vitreous detachment; RD retinal detachment; DM diabetes mellitus; DR diabetic retinopathy; NPDR non-proliferative diabetic retinopathy; PDR proliferative diabetic retinopathy; CSME clinically significant macular edema; DME diabetic macular edema; dbh dot blot hemorrhages; CWS cotton wool spot; POAG primary open angle glaucoma; C/D cup-to-disc ratio; HVF humphrey visual field; GVF goldmann visual field; OCT optical coherence tomography; IOP intraocular pressure; BRVO Branch retinal vein occlusion; CRVO central retinal vein occlusion; CRAO central retinal artery occlusion; BRAO branch retinal artery occlusion; RT retinal tear; SB scleral buckle; PPV pars plana vitrectomy; VH Vitreous hemorrhage; PRP panretinal laser photocoagulation; IVK intravitreal kenalog; VMT vitreomacular traction; MH Macular hole;  NVD neovascularization of the disc; NVE neovascularization elsewhere; AREDS age related eye disease study; ARMD age related macular degeneration; POAG primary open angle glaucoma; EBMD epithelial/anterior basement membrane dystrophy; ACIOL anterior chamber intraocular lens; IOL intraocular lens; PCIOL posterior chamber intraocular lens; Phaco/IOL phacoemulsification with intraocular lens placement; [place] photorefractive keratectomy; LASIK laser assisted in situ keratomileusis; HTN hypertension; DM diabetes mellitus; COPD chronic obstructive pulmonary disease

## 2019-09-01 DIAGNOSIS — I10 Essential (primary) hypertension: Secondary | ICD-10-CM | POA: Diagnosis not present

## 2019-09-04 ENCOUNTER — Encounter (INDEPENDENT_AMBULATORY_CARE_PROVIDER_SITE_OTHER): Payer: Self-pay | Admitting: Ophthalmology

## 2019-09-04 ENCOUNTER — Other Ambulatory Visit: Payer: Self-pay

## 2019-09-04 ENCOUNTER — Ambulatory Visit (INDEPENDENT_AMBULATORY_CARE_PROVIDER_SITE_OTHER): Payer: Medicare Other | Admitting: Ophthalmology

## 2019-09-04 DIAGNOSIS — H4921 Sixth [abducent] nerve palsy, right eye: Secondary | ICD-10-CM

## 2019-09-04 DIAGNOSIS — H532 Diplopia: Secondary | ICD-10-CM

## 2019-09-04 DIAGNOSIS — H4901 Third [oculomotor] nerve palsy, right eye: Secondary | ICD-10-CM | POA: Insufficient documentation

## 2019-09-04 NOTE — Progress Notes (Signed)
09/04/2019     CHIEF COMPLAINT Patient presents for Eye Exam and diplopia   HISTORY OF PRESENT ILLNESS: Jeanette Cross is a 86 y.o. female who presents to the clinic today for:   HPI    Eye Exam    In both eyes.  Characterized as blurry vision.  Severity is mild.          Comments    Patient states Wed/Thurs of last week, she began to see double vision. Patient states that the images are horizontally aligned. Patient states that the double vision is only present with both eyes open, when she covers one eye, it stops.       Last edited by Hurman Horn, MD on 09/04/2019  2:43 PM. (History)      Referring physician: Monico Blitz, MD [place],  [place] [address]  HISTORICAL INFORMATION:   Selected notes from the [facility]: No current outpatient medications on file. (Ophthalmic Drugs)   No current facility-administered medications for this visit. (Ophthalmic Drugs)   Current Outpatient Medications (Other)  Medication Sig  . allopurinol (ZYLOPRIM) 100 MG tablet Take 100 mg by mouth daily.  Marland Kitchen levothyroxine (SYNTHROID) 50 MCG tablet Take 50 mcg by mouth daily.  Marland Kitchen lisinopril (ZESTRIL) 2.5 MG tablet Take 2.5 mg by mouth daily.   No current facility-administered medications for this visit. (Other)      REVIEW OF SYSTEMS:    ALLERGIES Allergies  Allergen Reactions  . Novocain [Procaine]   . Tylenol [Acetaminophen]     PAST MEDICAL HISTORY Past Medical History:  Diagnosis Date  . Cancer ([place])   . Hypertension   . Macular degeneration    Past Surgical History:  Procedure Laterality Date  . CATARACT EXTRACTION W/PHACO Bilateral     FAMILY HISTORY Family History  Problem Relation Age of Onset  . Hypertension Sister     SOCIAL HISTORY Social History   Tobacco Use  . Smoking status: Never Smoker  . Smokeless tobacco: Never Used  Substance Use Topics  . Alcohol use: Not on file  . Drug use: Not on file          OPHTHALMIC EXAM:  Base Eye Exam    Visual Acuity (Snellen - Linear)      Right Left   Dist cc 20/30+1 20/25-2   Dist ph cc NI    Correction: Glasses       Tonometry (Tonopen, 2:03 PM)      Right Left   Pressure 9 11       Pupils      Pupils Dark Light Shape React APD   Right PERRL 4 4 Round Minimal None   Left PERRL 3 3 Round Minimal None       Visual Fields (Counting fingers)      Left Right    Full Full       Extraocular Movement      Right Left    Ortho Full, Ortho    0 0 0  -2  0  0 0 0   0 0 0  0  0  0 0 0         Neuro/Psych    Oriented x3: Yes   Mood/Affect: Normal       Dilation    Both eyes: 1.0% Mydriacyl, 2.5% Phenylephrine @ 2:03 PM        Strabismus Exam    Observations: Ortho  Distance Near Near +3DS N Bifocals                 0 0 0   0 0 0                     -2  0   0  0                     0 0 0   0 0 0                Slit Lamp and Fundus Exam    External Exam      Right Left   External Normal Normal       Slit Lamp Exam      Right Left   Lids/Lashes Normal Normal   Conjunctiva/Sclera White and quiet White and quiet   Cornea Clear Clear   Anterior Chamber Deep and quiet Deep and quiet   Iris Round and reactive Round and reactive   Lens Posterior chamber intraocular lens Posterior chamber intraocular lens   Anterior Vitreous Normal Normal       Fundus Exam      Right Left   Posterior Vitreous Posterior vitreous detachment Posterior vitreous detachment   Disc Normal Normal   C/D Ratio 0.4 0.3-0.4   Macula Geographic atrophy, Hard drusen, Retinal pigment epithelial mottling, no macular thickening, Cystoid macular edema Retinal pigment epithelial mottling, Early age related macular degeneration   Vessels Normal Normal   Periphery Normal Normal          IMAGING AND PROCEDURES  Imaging and Procedures for 09/04/19           ASSESSMENT/PLAN:  No problem-specific Assessment & Plan notes found  for this encounter.      ICD-10-CM   1. Diplopia  H53.2   2. 6th nerve palsy, right  H49.21     1.  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  Patient has a recent exacerbation of hypertension which may be causal in this case with his partial 6th nerve palsy.  There are no other optic nerve deficits.  3.  I will asked she return to see her primary care doctor for consideration of further management of the hypertension.  Moreover neuroimaging without contrast may be appropriate although it is likely that this is recent hypertension etiology.  Ophthalmic Meds Ordered this visit:  No orders of the defined types were placed in this encounter.      Return Patient will need to follow-up with her primary medical doctor for consideration of neuroimaging.  There are no Patient Instructions on file for this visit.   Explained the diagnoses, plan, and follow up with the patient and they expressed understanding.  Patient expressed understanding of the importance of proper follow up care.   Clent Demark Maryhelen Lindler M.D. Diseases & Surgery of the Retina and Vitreous Retina & Diabetic [place] 09/04/19     Abbreviations: M myopia (nearsighted); A astigmatism; H hyperopia (farsighted); P presbyopia; Mrx spectacle prescription;  CTL contact lenses; OD right eye; OS left eye; OU both eyes  XT exotropia; ET esotropia; PEK punctate epithelial keratitis; PEE punctate epithelial erosions; DES dry eye syndrome; MGD meibomian gland dysfunction; ATs artificial tears; PFAT's preservative free artificial tears; [place] nuclear sclerotic cataract; PSC posterior subcapsular cataract; ERM epi-retinal membrane; PVD posterior vitreous detachment; RD retinal detachment; DM diabetes mellitus; DR diabetic retinopathy; NPDR non-proliferative diabetic  retinopathy; PDR proliferative diabetic retinopathy; CSME clinically significant macular edema; DME diabetic macular edema; dbh dot blot  hemorrhages; CWS cotton wool spot; POAG primary open angle glaucoma; C/D cup-to-disc ratio; HVF humphrey visual field; GVF goldmann visual field; OCT optical coherence tomography; IOP intraocular pressure; BRVO Branch retinal vein occlusion; CRVO central retinal vein occlusion; CRAO central retinal artery occlusion; BRAO branch retinal artery occlusion; RT retinal tear; SB scleral buckle; PPV pars plana vitrectomy; VH Vitreous hemorrhage; PRP panretinal laser photocoagulation; IVK intravitreal kenalog; VMT vitreomacular traction; MH Macular hole;  NVD neovascularization of the disc; NVE neovascularization elsewhere; AREDS age related eye disease study; ARMD age related macular degeneration; POAG primary open angle glaucoma; EBMD epithelial/anterior basement membrane dystrophy; ACIOL anterior chamber intraocular lens; IOL intraocular lens; PCIOL posterior chamber intraocular lens; Phaco/IOL phacoemulsification with intraocular lens placement; [place] photorefractive keratectomy; LASIK laser assisted in situ keratomileusis; HTN hypertension; DM diabetes mellitus; COPD chronic obstructive pulmonary disease

## 2019-09-04 NOTE — Assessment & Plan Note (Signed)
Follow-up with Dr. Dartha Lodge,, for general medical evaluation, potentially improved control of blood pressure.  Neuro imaging is of modest importance, if undertaken might consider the avoidance of contrast media

## 2019-09-08 DIAGNOSIS — N2889 Other specified disorders of kidney and ureter: Secondary | ICD-10-CM | POA: Diagnosis not present

## 2019-09-08 DIAGNOSIS — D631 Anemia in chronic kidney disease: Secondary | ICD-10-CM | POA: Diagnosis not present

## 2019-09-08 DIAGNOSIS — R609 Edema, unspecified: Secondary | ICD-10-CM | POA: Diagnosis not present

## 2019-09-08 DIAGNOSIS — N183 Chronic kidney disease, stage 3 unspecified: Secondary | ICD-10-CM | POA: Diagnosis not present

## 2019-09-08 DIAGNOSIS — N2581 Secondary hyperparathyroidism of renal origin: Secondary | ICD-10-CM | POA: Diagnosis not present

## 2019-09-08 DIAGNOSIS — I129 Hypertensive chronic kidney disease with stage 1 through stage 4 chronic kidney disease, or unspecified chronic kidney disease: Secondary | ICD-10-CM | POA: Diagnosis not present

## 2019-09-11 DIAGNOSIS — I10 Essential (primary) hypertension: Secondary | ICD-10-CM | POA: Diagnosis not present

## 2019-09-11 DIAGNOSIS — H532 Diplopia: Secondary | ICD-10-CM | POA: Diagnosis not present

## 2019-09-11 DIAGNOSIS — Z299 Encounter for prophylactic measures, unspecified: Secondary | ICD-10-CM | POA: Diagnosis not present

## 2019-09-11 DIAGNOSIS — N184 Chronic kidney disease, stage 4 (severe): Secondary | ICD-10-CM | POA: Diagnosis not present

## 2019-09-11 DIAGNOSIS — H353290 Exudative age-related macular degeneration, unspecified eye, stage unspecified: Secondary | ICD-10-CM | POA: Diagnosis not present

## 2019-09-18 ENCOUNTER — Encounter (INDEPENDENT_AMBULATORY_CARE_PROVIDER_SITE_OTHER): Payer: Medicare Other | Admitting: Ophthalmology

## 2019-09-27 DIAGNOSIS — H532 Diplopia: Secondary | ICD-10-CM | POA: Diagnosis not present

## 2019-09-27 DIAGNOSIS — R9082 White matter disease, unspecified: Secondary | ICD-10-CM | POA: Diagnosis not present

## 2019-10-01 DIAGNOSIS — I10 Essential (primary) hypertension: Secondary | ICD-10-CM | POA: Diagnosis not present

## 2019-10-09 ENCOUNTER — Ambulatory Visit (INDEPENDENT_AMBULATORY_CARE_PROVIDER_SITE_OTHER): Payer: Medicare Other | Admitting: Ophthalmology

## 2019-10-09 ENCOUNTER — Other Ambulatory Visit: Payer: Self-pay

## 2019-10-09 ENCOUNTER — Encounter (INDEPENDENT_AMBULATORY_CARE_PROVIDER_SITE_OTHER): Payer: Self-pay | Admitting: Ophthalmology

## 2019-10-09 DIAGNOSIS — H353211 Exudative age-related macular degeneration, right eye, with active choroidal neovascularization: Secondary | ICD-10-CM

## 2019-10-09 MED ORDER — AFLIBERCEPT 2MG/0.05ML IZ SOLN FOR KALEIDOSCOPE
2.0000 mg | INTRAVITREAL | Status: AC | PRN
  Administered 2019-10-09: 2 mg via INTRAVITREAL

## 2019-10-09 NOTE — Assessment & Plan Note (Signed)
OD, CME covers over the area of RPE atrophy.  This is overall improved on therapy, still present  Need intravitreal Eylea OD today and examination in 6 weeks

## 2019-10-09 NOTE — Progress Notes (Signed)
10/09/2019     CHIEF COMPLAINT Patient presents for Retina Follow Up   HISTORY OF PRESENT ILLNESS: Jeanette Cross is a 86 y.o. female who presents to the clinic today for:   HPI    Retina Follow Up    Patient presents with  Wet AMD.  In right eye.  This started 6 weeks ago.  Severity is mild.  Duration of 6 weeks.  Since onset it is stable.          Comments    6 Week AMD F/U OD, poss Eylea OD  Pt reports intermittent spiderwebs OD, but sts she has seen these before. No other new symptoms reported OU. VA stable OU.       Last edited by Rockie Neighbours, [place] on 10/09/2019  9:56 AM. (History)      Referring physician: Monico Blitz, MD [place],  [place] [address]  HISTORICAL INFORMATION:   Selected notes from the [facility]: No current outpatient medications on file. (Ophthalmic Drugs)   No current facility-administered medications for this visit. (Ophthalmic Drugs)   Current Outpatient Medications (Other)  Medication Sig  . allopurinol (ZYLOPRIM) 100 MG tablet Take 100 mg by mouth daily.  Marland Kitchen levothyroxine (SYNTHROID) 50 MCG tablet Take 50 mcg by mouth daily.  Marland Kitchen lisinopril (ZESTRIL) 2.5 MG tablet Take 2.5 mg by mouth daily.   No current facility-administered medications for this visit. (Other)      REVIEW OF SYSTEMS:    ALLERGIES Allergies  Allergen Reactions  . Novocain [Procaine]   . Tylenol [Acetaminophen]     PAST MEDICAL HISTORY Past Medical History:  Diagnosis Date  . Cancer ([place])   . Hypertension   . Macular degeneration    Past Surgical History:  Procedure Laterality Date  . CATARACT EXTRACTION W/PHACO Bilateral     FAMILY HISTORY Family History  Problem Relation Age of Onset  . Hypertension Sister     SOCIAL HISTORY Social History   Tobacco Use  . Smoking status: Never Smoker  . Smokeless tobacco: Never Used  Substance Use Topics  . Alcohol use: Not on file  . Drug use: Not on file           OPHTHALMIC EXAM:  Base Eye Exam    Visual Acuity (ETDRS)      Right Left   Dist [place] 20/50 +1 20/30 -1   Dist cc 20/50 +1 20/40   Dist ph [place] NI NI   Dist ph cc NI NI   Correction: Glasses       Tonometry (Tonopen, 10:02 AM)      Right Left   Pressure 14 16       Pupils      Pupils Dark Light Shape React APD   Right PERRL 4 3 Round Sluggish None   Left PERRL 4 3 Round Sluggish None       Visual Fields (Counting fingers)      Left Right     Full       Extraocular Movement      Right Left    Full Full       Neuro/Psych    Oriented x3: Yes   Mood/Affect: Normal       Dilation    Right eye: 1.0% Mydriacyl, 2.5% Phenylephrine @ 10:02 AM        Slit Lamp and Fundus Exam    External Exam  Right Left   External Normal Normal       Slit Lamp Exam      Right Left   Lids/Lashes Normal Normal   Conjunctiva/Sclera White and quiet White and quiet   Cornea Clear Clear   Anterior Chamber Deep and quiet Deep and quiet   Iris Round and reactive Round and reactive   Lens Posterior chamber intraocular lens Posterior chamber intraocular lens   Anterior Vitreous Normal Normal       Fundus Exam      Right Left   Posterior Vitreous Posterior vitreous detachment    Disc Normal    C/D Ratio 0.4    Macula Geographic atrophy, Hard drusen, Retinal pigment epithelial mottling, no macular thickening, Cystoid macular edema    Vessels Normal    Periphery Normal           IMAGING AND PROCEDURES  Imaging and Procedures for 10/09/19  OCT, Retina - OU - Both Eyes       Right Eye Scan locations included subfoveal. Central Foveal Thickness: 306. Progression has improved. Findings include abnormal foveal contour, cystoid macular edema.   Left Eye Scan locations included subfoveal. Central Foveal Thickness: 284. Progression has been stable. Findings include retinal drusen , no SRF.   Notes OD, CME covers over the area of RPE atrophy.  This is overall  improved on therapy, still present  Need intravitreal Eylea OD today and examination in 6 weeks       Intravitreal Injection, Pharmacologic Agent - OD - Right Eye       Time Out 10/09/2019. 11:13 AM. Confirmed correct patient, procedure, site, and patient consented.   Anesthesia Topical anesthesia was used. Anesthetic medications included [place] 3.5%.   Procedure Preparation included 10% betadine to eyelids, Tobramycin 0.3%. A 30 gauge needle was used.   Injection:  2 mg aflibercept Alfonse Flavors) SOLN   NDC: [id number], Lot: [id number]   Route: Intravitreal, Site: Right Eye, Waste: 0 mg  Post-op Post injection exam found visual acuity of at least counting fingers. The patient tolerated the procedure well. There were no complications. The patient received written and verbal post procedure care education.                 ASSESSMENT/PLAN:  Exudative age-related macular degeneration of right eye with active choroidal neovascularization (HCC) OD, CME covers over the area of RPE atrophy.  This is overall improved on therapy, still present  Need intravitreal Eylea OD today and examination in 6 weeks      ICD-10-CM   1. 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  2.  3.  Ophthalmic Meds Ordered this visit:  Meds ordered this encounter  Medications  . aflibercept (EYLEA) SOLN 2 mg       Return in about 6 weeks (around 11/20/2019) for dilate, OD, EYLEA OCT.  There are no Patient Instructions on file for this visit.   Explained the diagnoses, plan, and follow up with the patient and they expressed understanding.  Patient expressed understanding of the importance of proper follow up care.   Clent Demark Adalberto Metzgar M.D. Diseases & Surgery of the Retina and Vitreous Retina &  Diabetic [place] 10/09/19     Abbreviations: M myopia (nearsighted); A astigmatism; H hyperopia (farsighted); P presbyopia;  Mrx spectacle prescription;  CTL contact lenses; OD right eye; OS left eye; OU both eyes  XT exotropia; ET esotropia; PEK punctate epithelial keratitis; PEE punctate epithelial erosions; DES dry eye syndrome; MGD meibomian gland dysfunction; ATs artificial tears; PFAT's preservative free artificial tears; [place] nuclear sclerotic cataract; PSC posterior subcapsular cataract; ERM epi-retinal membrane; PVD posterior vitreous detachment; RD retinal detachment; DM diabetes mellitus; DR diabetic retinopathy; NPDR non-proliferative diabetic retinopathy; PDR proliferative diabetic retinopathy; CSME clinically significant macular edema; DME diabetic macular edema; dbh dot blot hemorrhages; CWS cotton wool spot; POAG primary open angle glaucoma; C/D cup-to-disc ratio; HVF humphrey visual field; GVF goldmann visual field; OCT optical coherence tomography; IOP intraocular pressure; BRVO Branch retinal vein occlusion; CRVO central retinal vein occlusion; CRAO central retinal artery occlusion; BRAO branch retinal artery occlusion; RT retinal tear; SB scleral buckle; PPV pars plana vitrectomy; VH Vitreous hemorrhage; PRP panretinal laser photocoagulation; IVK intravitreal kenalog; VMT vitreomacular traction; MH Macular hole;  NVD neovascularization of the disc; NVE neovascularization elsewhere; AREDS age related eye disease study; ARMD age related macular degeneration; POAG primary open angle glaucoma; EBMD epithelial/anterior basement membrane dystrophy; ACIOL anterior chamber intraocular lens; IOL intraocular lens; PCIOL posterior chamber intraocular lens; Phaco/IOL phacoemulsification with intraocular lens placement; [place] photorefractive keratectomy; LASIK laser assisted in situ keratomileusis; HTN hypertension; DM diabetes mellitus; COPD chronic obstructive pulmonary disease

## 2019-11-01 DIAGNOSIS — I10 Essential (primary) hypertension: Secondary | ICD-10-CM | POA: Diagnosis not present

## 2019-11-14 IMAGING — US US RENAL
1 series · 14 of 25 positions shown · non-contrast
Comparison: None.

CLINICAL DATA: Chronic kidney disease. Decreased GFR. History of
hypertension.

EXAM:
RENAL / URINARY TRACT ULTRASOUND COMPLETE

[Series 1: us renal · 0.22mm/px · 14 of 59 slices shown]
[im 1/59]
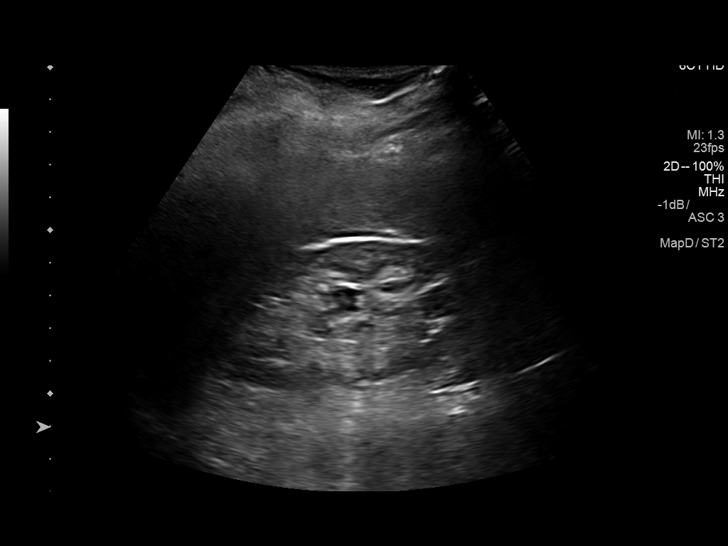
[im 5/59]
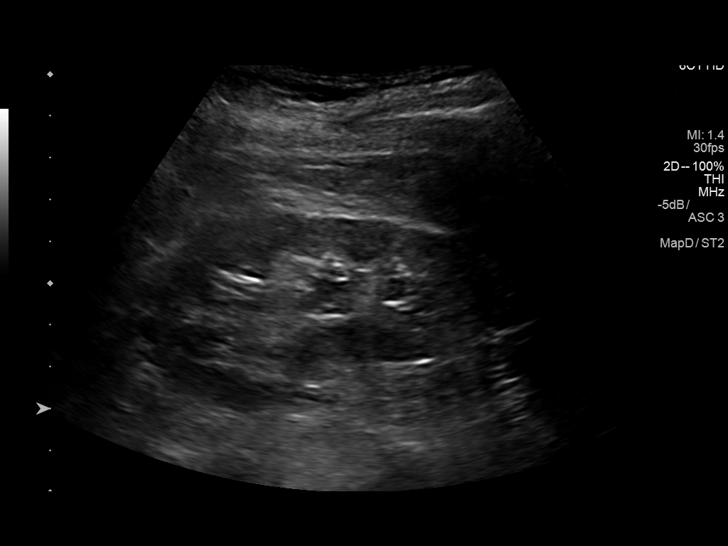
[im 10/59]
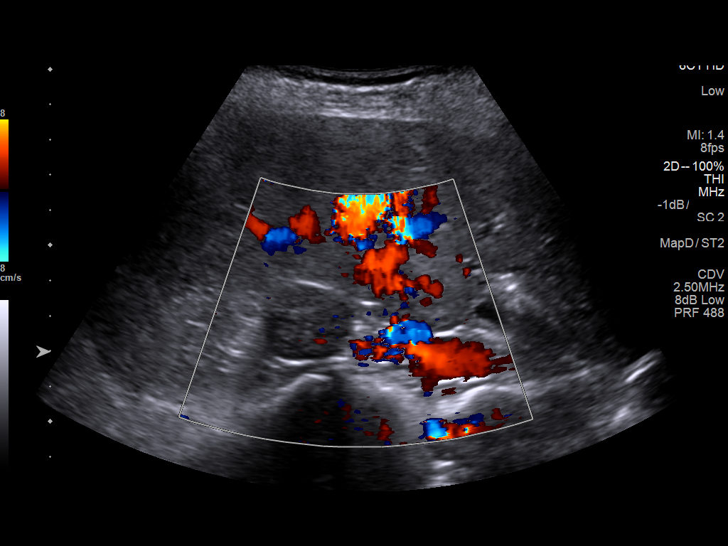
[im 15/59]
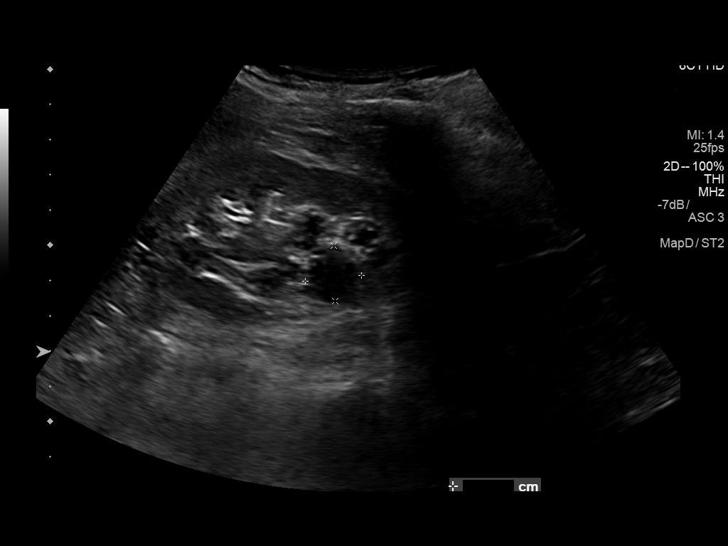
[im 20/59]
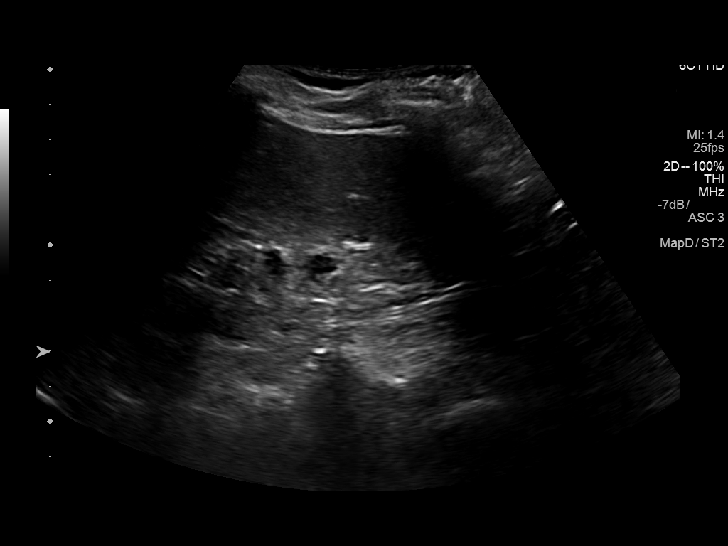
[im 22/59]
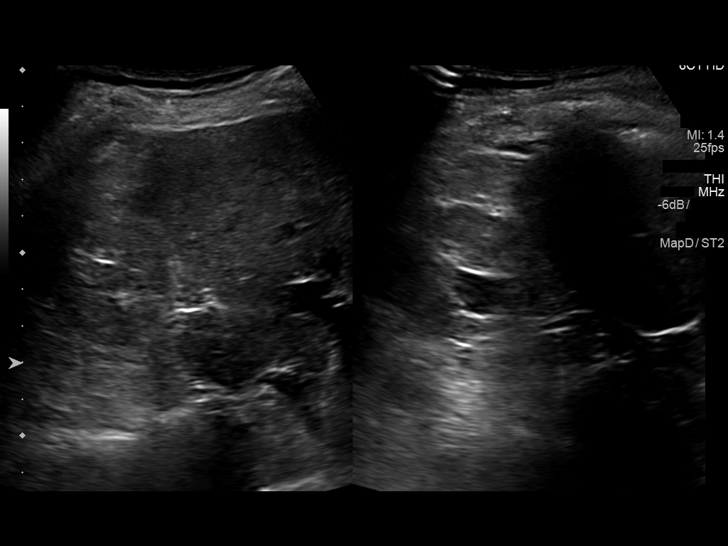
[im 27/59]
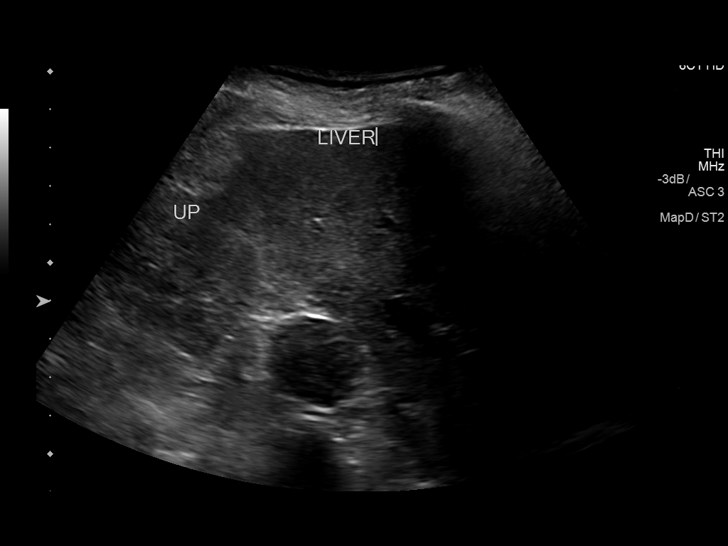
[im 32/59]
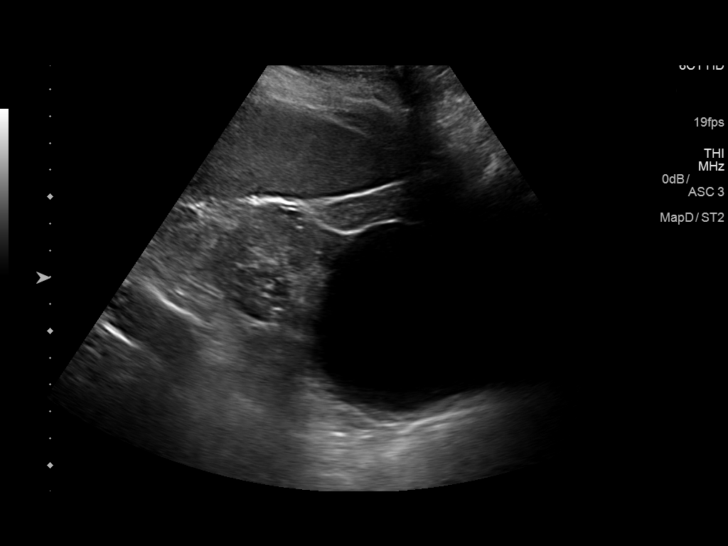
[im 37/59]
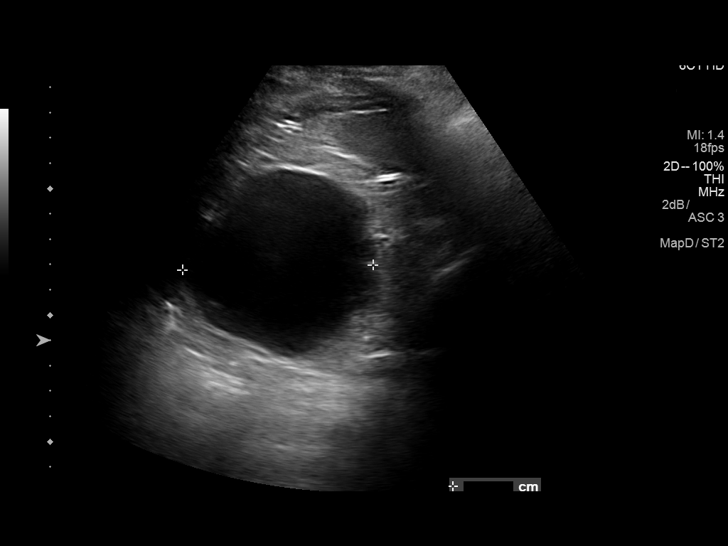
[im 39/59]
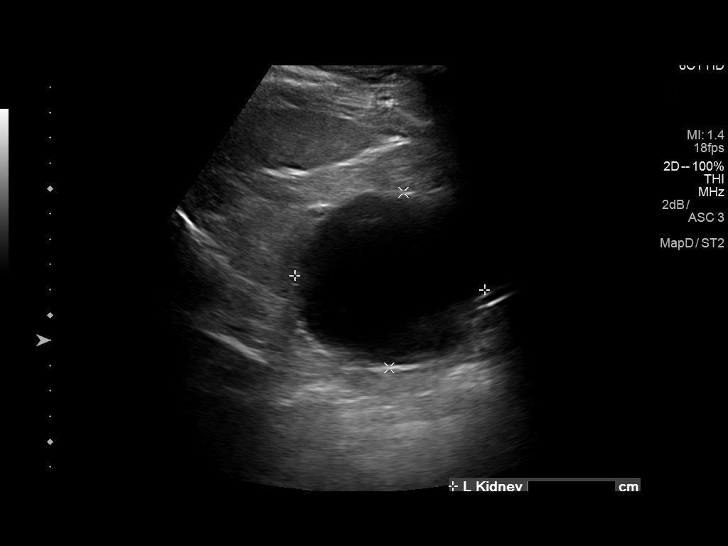
[im 44/59]
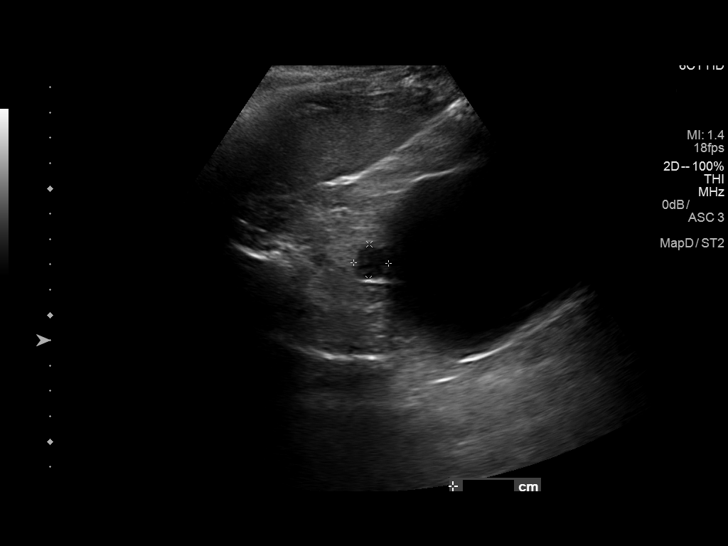
[im 49/59]
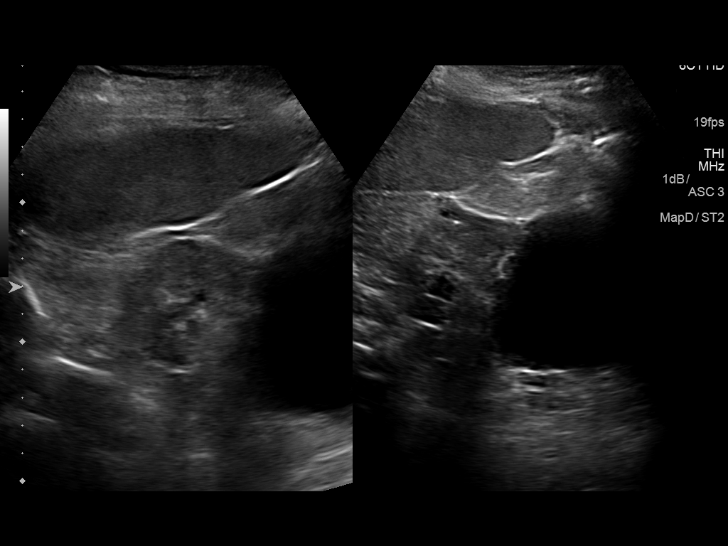
[im 54/59]
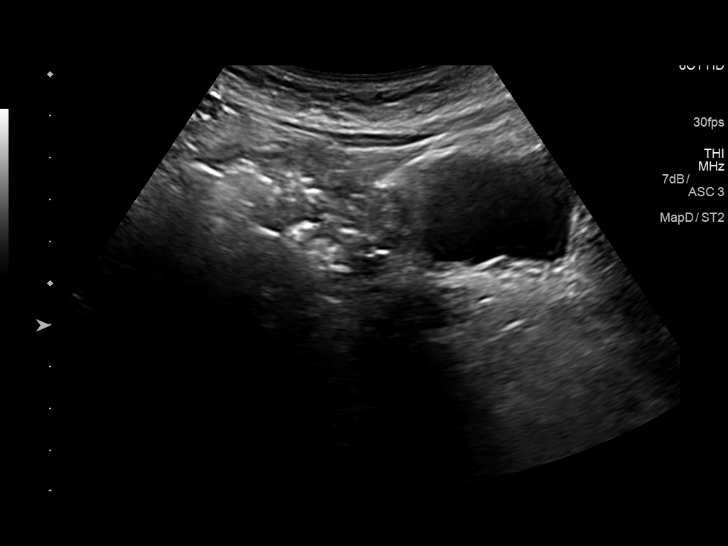
[im 59/59]
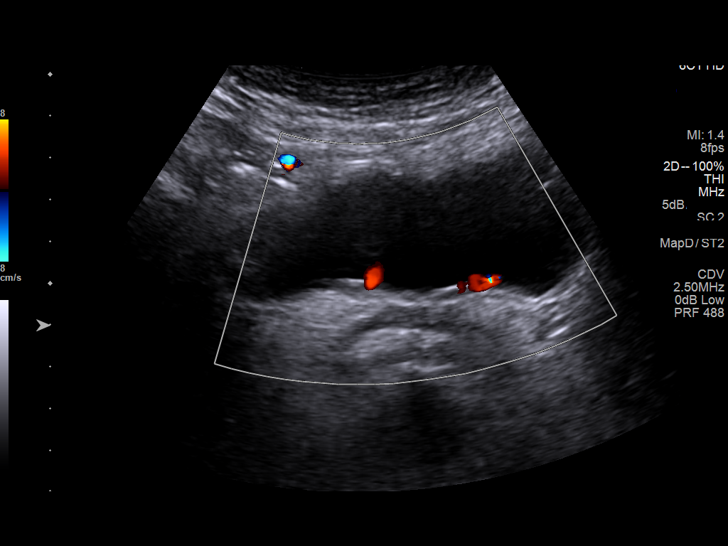

[14 of 25 positions shown; findings below may reference images not displayed]

FINDINGS: Right Kidney:

Length: 9.1 cm. Mild diffuse cortical thinning. 1.9 cm renal sinus
cyst. Arising from the upper medial right kidney there is a
solid-appearing mass measuring 2.5 cm. No stones. No hydronephrosis.

Left Kidney:

Length: 9.2 cm. Normal parenchymal echogenicity. Large cyst arises
from the mid to lower pole measuring 7.5 cm. There is a smaller
adjacent 1.6 cm cyst. No convincing solid masses. No stones. No
hydronephrosis.

Bladder:

Appears normal for degree of bladder distention.
IMPRESSION: 1. No acute findings.  No hydronephrosis.
2. Mild diffuse right renal cortical thinning.
3. Solid-appearing medial upper pole right renal mass measuring
cm. Further evaluation and lesion characterization with renal MRI
with and without contrast is recommended.
4. Bilateral renal cysts.

## 2019-11-20 ENCOUNTER — Ambulatory Visit (INDEPENDENT_AMBULATORY_CARE_PROVIDER_SITE_OTHER): Payer: Medicare Other | Admitting: Ophthalmology

## 2019-11-20 ENCOUNTER — Other Ambulatory Visit: Payer: Self-pay

## 2019-11-20 ENCOUNTER — Encounter (INDEPENDENT_AMBULATORY_CARE_PROVIDER_SITE_OTHER): Payer: Self-pay | Admitting: Ophthalmology

## 2019-11-20 DIAGNOSIS — H353211 Exudative age-related macular degeneration, right eye, with active choroidal neovascularization: Secondary | ICD-10-CM | POA: Diagnosis not present

## 2019-11-20 DIAGNOSIS — H353212 Exudative age-related macular degeneration, right eye, with inactive choroidal neovascularization: Secondary | ICD-10-CM

## 2019-11-20 MED ORDER — AFLIBERCEPT 2MG/0.05ML IZ SOLN FOR KALEIDOSCOPE
2.0000 mg | INTRAVITREAL | Status: AC | PRN
Start: 2019-11-20 — End: 2019-11-20
  Administered 2019-11-20: 2 mg via INTRAVITREAL

## 2019-11-20 NOTE — Assessment & Plan Note (Signed)
OD, with much less perifoveal CME temporally, at 6-week interval follow-up.  On intravitreal Eylea, repeat today and examination in 7 weeks

## 2019-11-20 NOTE — Progress Notes (Signed)
11/20/2019     CHIEF COMPLAINT Patient presents for Retina Follow Up   HISTORY OF PRESENT ILLNESS: Jeanette Cross is a 86 y.o. female who presents to the clinic today for:   HPI    Retina Follow Up    Patient presents with  Wet AMD.  In right eye.  This started 6 weeks ago.  Duration of 6 weeks.  Since onset it is stable.          Comments    6 week f/u with possible Eylea OD, dilated exam and OCT(MAC) today Pt denies noticeable changes to [place] OU since last visit. Pt denies ocular pain, flashes of light, or floaters OU.         Last edited by Melburn Popper, COA on 11/20/2019  9:56 AM. (History)      Referring physician: Monico Blitz, MD [place],  [place] [address]  HISTORICAL INFORMATION:   Selected notes from the [facility]: No current outpatient medications on file. (Ophthalmic Drugs)   No current facility-administered medications for this visit. (Ophthalmic Drugs)   Current Outpatient Medications (Other)  Medication Sig  . allopurinol (ZYLOPRIM) 100 MG tablet Take 100 mg by mouth daily.  Marland Kitchen levothyroxine (SYNTHROID) 50 MCG tablet Take 50 mcg by mouth daily.  Marland Kitchen lisinopril (ZESTRIL) 2.5 MG tablet Take 2.5 mg by mouth daily.   No current facility-administered medications for this visit. (Other)      REVIEW OF SYSTEMS:    ALLERGIES Allergies  Allergen Reactions  . Novocain [Procaine]   . Tylenol [Acetaminophen]     PAST MEDICAL HISTORY Past Medical History:  Diagnosis Date  . Cancer ([place])   . Hypertension   . Macular degeneration    Past Surgical History:  Procedure Laterality Date  . CATARACT EXTRACTION W/PHACO Bilateral     FAMILY HISTORY Family History  Problem Relation Age of Onset  . Hypertension Sister     SOCIAL HISTORY Social History   Tobacco Use  . Smoking status: Never Smoker  . Smokeless tobacco: Never Used  Substance Use Topics  . Alcohol use: Not on file  . Drug use:  Not on file         OPHTHALMIC EXAM:  Base Eye Exam    Visual Acuity (ETDRS)      Right Left   Dist [place] 20/50 +1 20/30   Dist ph [place] NI NI       Tonometry (Tonopen, 10:00 AM)      Right Left   Pressure 10 11       Pupils      Pupils Dark Light Shape React APD   Right PERRL 4 3 Round Slow None   Left PERRL 4 3 Round Slow None       Visual Fields (Counting fingers)      Left Right    Full Full       Extraocular Movement      Right Left    Full Full       Neuro/Psych    Oriented x3: Yes   Mood/Affect: Normal       Dilation    Right eye: 1.0% Mydriacyl, 2.5% Phenylephrine @ 10:00 AM        Slit Lamp and Fundus Exam    External Exam      Right Left   External Normal Normal       Slit Lamp Exam  Right Left   Lids/Lashes Normal Normal   Conjunctiva/Sclera White and quiet White and quiet   Cornea Clear Clear   Anterior Chamber Deep and quiet Deep and quiet   Iris Round and reactive Round and reactive   Lens Posterior chamber intraocular lens Posterior chamber intraocular lens   Anterior Vitreous Normal Normal       Fundus Exam      Right Left   Posterior Vitreous Posterior vitreous detachment    Disc Normal    C/D Ratio 0.4    Macula Geographic atrophy, Hard drusen, Retinal pigment epithelial mottling, no macular thickening    Vessels Normal    Periphery Normal           IMAGING AND PROCEDURES  Imaging and Procedures for 11/20/19  OCT, Retina - OU - Both Eyes       Right Eye Central Foveal Thickness: 279. Progression has improved. Findings include abnormal foveal contour, no SRF, no IRF, retinal drusen .   Left Eye Quality was good. Scan locations included subfoveal. Central Foveal Thickness: 291. Progression has been stable. Findings include no SRF, no IRF, retinal drusen .   Notes OD with much less CME, at 6-week interval examination post intravitreal Eylea OD       Intravitreal Injection, Pharmacologic Agent - OD - Right Eye         Time Out 11/20/2019. 11:01 AM. Confirmed correct patient, procedure, site, and patient consented.   Anesthesia Topical anesthesia was used. Anesthetic medications included Akten 3.5%.   Procedure Preparation included Ofloxacin , 10% betadine to eyelids, 5% betadine to ocular surface. A 30 gauge needle was used.   Injection:  2 mg aflibercept Alfonse Flavors) SOLN   NDC: [id number], Lot: [id number]   Route: Intravitreal, Site: Right Eye, Waste: 0 mg  Post-op Post injection exam found visual acuity of at least counting fingers. The patient tolerated the procedure well. There were no complications. The patient received written and verbal post procedure care education. Post injection medications were not given.                 ASSESSMENT/PLAN:  Exudative age-related macular degeneration of right eye with active choroidal neovascularization (HCC) OD, with much less perifoveal CME temporally, at 6-week interval follow-up.  On intravitreal Eylea, repeat today and examination in 7 weeks      ICD-10-CM   1. Exudative age-related macular degeneration of right eye with active choroidal neovascularization (HCC)  H35.3211 Intravitreal Injection, Pharmacologic Agent - OD - Right Eye    aflibercept (EYLEA) SOLN 2 mg  2. Exudative age-related macular degeneration of right eye with inactive choroidal neovascularization (HCC)  H35.3212 OCT, Retina - OU - Both Eyes    1.  2.  3.  Ophthalmic Meds Ordered this visit:  Meds ordered this encounter  Medications  . aflibercept (EYLEA) SOLN 2 mg       Return in about 7 weeks (around 01/08/2020) for dilate, OD, EYLEA OCT.  There are no Patient Instructions on file for this visit.   Explained the diagnoses, plan, and follow up with the patient and they expressed understanding.  Patient expressed understanding of the importance of proper follow up care.   Clent Demark Esteen Delpriore M.D. Diseases & Surgery of the Retina and Vitreous Retina &  Diabetic [place] 11/20/19     Abbreviations: M myopia (nearsighted); A astigmatism; H hyperopia (farsighted); P presbyopia; Mrx spectacle prescription;  CTL contact lenses; OD right eye; OS left eye; OU both eyes  XT exotropia; ET esotropia; PEK punctate epithelial keratitis; PEE punctate epithelial erosions; DES dry eye syndrome; MGD meibomian gland dysfunction; ATs artificial tears; PFAT's preservative free artificial tears; NSC nuclear sclerotic cataract; PSC posterior subcapsular cataract; ERM epi-retinal membrane; PVD posterior vitreous detachment; RD retinal detachment; DM diabetes mellitus; DR diabetic retinopathy; NPDR non-proliferative diabetic retinopathy; PDR proliferative diabetic retinopathy; CSME clinically significant macular edema; DME diabetic macular edema; dbh dot blot hemorrhages; CWS cotton wool spot; POAG primary open angle glaucoma; C/D cup-to-disc ratio; HVF humphrey visual field; GVF goldmann visual field; OCT optical coherence tomography; IOP intraocular pressure; BRVO Branch retinal vein occlusion; CRVO central retinal vein occlusion; CRAO central retinal artery occlusion; BRAO branch retinal artery occlusion; RT retinal tear; SB scleral buckle; PPV pars plana vitrectomy; VH Vitreous hemorrhage; PRP panretinal laser photocoagulation; IVK intravitreal kenalog; VMT vitreomacular traction; MH Macular hole;  NVD neovascularization of the disc; NVE neovascularization elsewhere; AREDS age related eye disease study; ARMD age related macular degeneration; POAG primary open angle glaucoma; EBMD epithelial/anterior basement membrane dystrophy; ACIOL anterior chamber intraocular lens; IOL intraocular lens; PCIOL posterior chamber intraocular lens; Phaco/IOL phacoemulsification with intraocular lens placement; PRK photorefractive keratectomy; LASIK laser assisted in situ keratomileusis; HTN hypertension; DM diabetes mellitus; COPD chronic obstructive pulmonary disease 

## 2019-12-01 DIAGNOSIS — I10 Essential (primary) hypertension: Secondary | ICD-10-CM | POA: Diagnosis not present

## 2019-12-06 DIAGNOSIS — I10 Essential (primary) hypertension: Secondary | ICD-10-CM | POA: Diagnosis not present

## 2019-12-06 DIAGNOSIS — R05 Cough: Secondary | ICD-10-CM | POA: Diagnosis not present

## 2019-12-06 DIAGNOSIS — Z299 Encounter for prophylactic measures, unspecified: Secondary | ICD-10-CM | POA: Diagnosis not present

## 2019-12-06 DIAGNOSIS — N184 Chronic kidney disease, stage 4 (severe): Secondary | ICD-10-CM | POA: Diagnosis not present

## 2019-12-06 DIAGNOSIS — Z6823 Body mass index (BMI) 23.0-23.9, adult: Secondary | ICD-10-CM | POA: Diagnosis not present

## 2019-12-06 DIAGNOSIS — H353290 Exudative age-related macular degeneration, unspecified eye, stage unspecified: Secondary | ICD-10-CM | POA: Diagnosis not present

## 2020-01-02 DIAGNOSIS — I10 Essential (primary) hypertension: Secondary | ICD-10-CM | POA: Diagnosis not present

## 2020-01-09 ENCOUNTER — Encounter (INDEPENDENT_AMBULATORY_CARE_PROVIDER_SITE_OTHER): Payer: Self-pay | Admitting: Ophthalmology

## 2020-01-09 ENCOUNTER — Ambulatory Visit (INDEPENDENT_AMBULATORY_CARE_PROVIDER_SITE_OTHER): Payer: Medicare Other | Admitting: Ophthalmology

## 2020-01-09 ENCOUNTER — Other Ambulatory Visit: Payer: Self-pay

## 2020-01-09 DIAGNOSIS — H353211 Exudative age-related macular degeneration, right eye, with active choroidal neovascularization: Secondary | ICD-10-CM

## 2020-01-09 DIAGNOSIS — H353212 Exudative age-related macular degeneration, right eye, with inactive choroidal neovascularization: Secondary | ICD-10-CM | POA: Diagnosis not present

## 2020-01-09 MED ORDER — AFLIBERCEPT 2MG/0.05ML IZ SOLN FOR KALEIDOSCOPE
2.0000 mg | INTRAVITREAL | Status: AC | PRN
Start: 2020-01-09 — End: 2020-01-09
  Administered 2020-01-09: 2 mg via INTRAVITREAL

## 2020-01-09 NOTE — Assessment & Plan Note (Signed)
OD currently on Eylea 7-week exam interval, improved macular anatomy and stable visual acuity will repeat injection today and examination in 8 weeks

## 2020-01-09 NOTE — Patient Instructions (Signed)
To report new onset visual acuity distortions or declines

## 2020-01-09 NOTE — Progress Notes (Signed)
01/09/2020     CHIEF COMPLAINT Patient presents for Retina Follow Up   HISTORY OF PRESENT ILLNESS: Jeanette Cross is a 86 y.o. female who presents to the clinic today for:   HPI    Retina Follow Up    Patient presents with  Wet AMD.  In right eye.  This started 7 weeks ago.  Severity is mild.  Duration of 7 weeks.  Since onset it is stable.          Comments    7 Week AMD F/U OD, poss Eylea OD  Pt denies noticeable changes to [place] OU since last visit. Pt denies ocular pain, flashes of light, or floaters OU.         Last edited by Rockie Neighbours, Ferdinand on 01/09/2020  9:31 AM. (History)      Referring physician: Monico Blitz, MD [place],  [place] [address]  HISTORICAL INFORMATION:   Selected notes from the [facility]: No current outpatient medications on file. (Ophthalmic Drugs)   No current facility-administered medications for this visit. (Ophthalmic Drugs)   Current Outpatient Medications (Other)  Medication Sig   allopurinol (ZYLOPRIM) 100 MG tablet Take 100 mg by mouth daily.   levothyroxine (SYNTHROID) 50 MCG tablet Take 50 mcg by mouth daily.   lisinopril (ZESTRIL) 2.5 MG tablet Take 2.5 mg by mouth daily.   No current facility-administered medications for this visit. (Other)      REVIEW OF SYSTEMS:    ALLERGIES Allergies  Allergen Reactions   Novocain [Procaine]    Tylenol [Acetaminophen]     PAST MEDICAL HISTORY Past Medical History:  Diagnosis Date   Cancer ([place])    Hypertension    Macular degeneration    Past Surgical History:  Procedure Laterality Date   CATARACT EXTRACTION W/PHACO Bilateral     FAMILY HISTORY Family History  Problem Relation Age of Onset   Hypertension Sister     SOCIAL HISTORY Social History   Tobacco Use   Smoking status: Never Smoker   Smokeless tobacco: Never Used  Substance Use Topics   Alcohol use: Not on file   Drug use: Not on file          OPHTHALMIC EXAM: Base Eye Exam    Visual Acuity (ETDRS)      Right Left   Dist [place] 20/60 -2 20/30 -1   Dist ph [place] 20/30 -2 20/25 -2       Tonometry (Tonopen, 9:32 AM)      Right Left   Pressure 06 10       Pupils      Pupils Dark Light Shape React APD   Right PERRL 4 3 Round Slow None   Left PERRL 4 3 Round Slow None       Visual Fields (Counting fingers)      Left Right    Full Full       Extraocular Movement      Right Left    Full Full       Neuro/Psych    Oriented x3: Yes   Mood/Affect: Normal       Dilation    Right eye: 1.0% Mydriacyl, 2.5% Phenylephrine @ 9:36 AM        Slit Lamp and Fundus Exam    External Exam      Right Left   External Normal Normal       Slit Lamp Exam  Right Left   Lids/Lashes Normal Normal   Conjunctiva/Sclera White and quiet White and quiet   Cornea Clear Clear   Anterior Chamber Deep and quiet Deep and quiet   Iris Round and reactive Round and reactive   Lens Posterior chamber intraocular lens Posterior chamber intraocular lens   Anterior Vitreous Normal Normal       Fundus Exam      Right Left   Posterior Vitreous Posterior vitreous detachment    Disc Normal    C/D Ratio 0.4    Macula Geographic atrophy, Hard drusen, Retinal pigment epithelial mottling, no macular thickening    Vessels Normal    Periphery Normal           IMAGING AND PROCEDURES  Imaging and Procedures for 01/09/20  OCT, Retina - OU - Both Eyes       Right Eye Quality was good. Scan locations included subfoveal. Central Foveal Thickness: 279. Progression has improved. Findings include abnormal foveal contour, no SRF, no IRF, retinal drusen .   Left Eye Quality was good. Scan locations included subfoveal. Central Foveal Thickness: 280. Progression has been stable. Findings include no SRF, no IRF, retinal drusen .   Notes OD with much less CME, at 7-week interval examination post intravitreal Eylea OD, repeat injection today  and examination in 8 weeks       Intravitreal Injection, Pharmacologic Agent - OD - Right Eye       Time Out 01/09/2020. 10:00 AM. Confirmed correct patient, procedure, site, and patient consented.   Anesthesia Topical anesthesia was used. Anesthetic medications included Akten 3.5%.   Procedure Preparation included Ofloxacin , 10% betadine to eyelids, 5% betadine to ocular surface. A 30 gauge needle was used.   Injection:  2 mg aflibercept Alfonse Flavors) SOLN   NDC: [id number], Lot: [id number]   Route: Intravitreal, Site: Right Eye, Waste: 0 mg  Post-op Post injection exam found visual acuity of at least counting fingers. The patient tolerated the procedure well. There were no complications. The patient received written and verbal post procedure care education. Post injection medications were not given.                 ASSESSMENT/PLAN:  Exudative age-related macular degeneration of right eye with active choroidal neovascularization (HCC) OD currently on Eylea 7-week exam interval, improved macular anatomy and stable visual acuity will repeat injection today and examination in 8 weeks      ICD-10-CM   1. Exudative age-related macular degeneration of right eye with active choroidal neovascularization (HCC)  H35.3211 Intravitreal Injection, Pharmacologic Agent - OD - Right Eye    aflibercept (EYLEA) SOLN 2 mg  2. Exudative age-related macular degeneration of right eye with inactive choroidal neovascularization (HCC)  H35.3212 OCT, Retina - OU - Both Eyes    1.  REPete examination OD 8 weeks, possible intravitreal Eylea  2.  3.  Ophthalmic Meds Ordered this visit:  Meds ordered this encounter  Medications   aflibercept (EYLEA) SOLN 2 mg       Return in about 8 weeks (around 03/05/2020) for dilate, OD, EYLEA OCT.  There are no Patient Instructions on file for this visit.   Explained the diagnoses, plan, and follow up with the patient and they expressed  understanding.  Patient expressed understanding of the importance of proper follow up care.   Clent Demark Tallia Moehring M.D. Diseases & Surgery of the Retina and Vitreous Retina & Diabetic [place] 01/09/20     Abbreviations: M myopia (nearsighted);  A astigmatism; H hyperopia (farsighted); P presbyopia; Mrx spectacle prescription;  CTL contact lenses; OD right eye; OS left eye; OU both eyes  XT exotropia; ET esotropia; PEK punctate epithelial keratitis; PEE punctate epithelial erosions; DES dry eye syndrome; MGD meibomian gland dysfunction; ATs artificial tears; PFAT's preservative free artificial tears; [place] nuclear sclerotic cataract; PSC posterior subcapsular cataract; ERM epi-retinal membrane; PVD posterior vitreous detachment; RD retinal detachment; DM diabetes mellitus; DR diabetic retinopathy; NPDR non-proliferative diabetic retinopathy; PDR proliferative diabetic retinopathy; CSME clinically significant macular edema; DME diabetic macular edema; dbh dot blot hemorrhages; CWS cotton wool spot; POAG primary open angle glaucoma; C/D cup-to-disc ratio; HVF humphrey visual field; GVF goldmann visual field; OCT optical coherence tomography; IOP intraocular pressure; BRVO Branch retinal vein occlusion; CRVO central retinal vein occlusion; CRAO central retinal artery occlusion; BRAO branch retinal artery occlusion; RT retinal tear; SB scleral buckle; PPV pars plana vitrectomy; VH Vitreous hemorrhage; PRP panretinal laser photocoagulation; IVK intravitreal kenalog; VMT vitreomacular traction; MH Macular hole;  NVD neovascularization of the disc; NVE neovascularization elsewhere; AREDS age related eye disease study; ARMD age related macular degeneration; POAG primary open angle glaucoma; EBMD epithelial/anterior basement membrane dystrophy; ACIOL anterior chamber intraocular lens; IOL intraocular lens; PCIOL posterior chamber intraocular lens; Phaco/IOL phacoemulsification with intraocular lens placement; [place]  photorefractive keratectomy; LASIK laser assisted in situ keratomileusis; HTN hypertension; DM diabetes mellitus; COPD chronic obstructive pulmonary disease

## 2020-02-01 DIAGNOSIS — I10 Essential (primary) hypertension: Secondary | ICD-10-CM | POA: Diagnosis not present

## 2020-02-20 DIAGNOSIS — Z23 Encounter for immunization: Secondary | ICD-10-CM | POA: Diagnosis not present

## 2020-02-20 DIAGNOSIS — Z1339 Encounter for screening examination for other mental health and behavioral disorders: Secondary | ICD-10-CM | POA: Diagnosis not present

## 2020-02-20 DIAGNOSIS — Z79899 Other long term (current) drug therapy: Secondary | ICD-10-CM | POA: Diagnosis not present

## 2020-02-20 DIAGNOSIS — Z0000 Encounter for general adult medical examination without abnormal findings: Secondary | ICD-10-CM | POA: Diagnosis not present

## 2020-02-20 DIAGNOSIS — E039 Hypothyroidism, unspecified: Secondary | ICD-10-CM | POA: Diagnosis not present

## 2020-02-20 DIAGNOSIS — R5383 Other fatigue: Secondary | ICD-10-CM | POA: Diagnosis not present

## 2020-02-20 DIAGNOSIS — Z7189 Other specified counseling: Secondary | ICD-10-CM | POA: Diagnosis not present

## 2020-02-20 DIAGNOSIS — E7800 Pure hypercholesterolemia, unspecified: Secondary | ICD-10-CM | POA: Diagnosis not present

## 2020-02-20 DIAGNOSIS — Z1331 Encounter for screening for depression: Secondary | ICD-10-CM | POA: Diagnosis not present

## 2020-02-20 DIAGNOSIS — Z299 Encounter for prophylactic measures, unspecified: Secondary | ICD-10-CM | POA: Diagnosis not present

## 2020-02-20 DIAGNOSIS — E559 Vitamin D deficiency, unspecified: Secondary | ICD-10-CM | POA: Diagnosis not present

## 2020-02-20 DIAGNOSIS — Z6823 Body mass index (BMI) 23.0-23.9, adult: Secondary | ICD-10-CM | POA: Diagnosis not present

## 2020-03-02 DIAGNOSIS — I10 Essential (primary) hypertension: Secondary | ICD-10-CM | POA: Diagnosis not present

## 2020-03-04 DIAGNOSIS — L0201 Cutaneous abscess of face: Secondary | ICD-10-CM | POA: Diagnosis not present

## 2020-03-04 DIAGNOSIS — C44329 Squamous cell carcinoma of skin of other parts of face: Secondary | ICD-10-CM | POA: Diagnosis not present

## 2020-03-04 DIAGNOSIS — D485 Neoplasm of uncertain behavior of skin: Secondary | ICD-10-CM | POA: Diagnosis not present

## 2020-03-05 ENCOUNTER — Ambulatory Visit (INDEPENDENT_AMBULATORY_CARE_PROVIDER_SITE_OTHER): Payer: Medicare Other | Admitting: Ophthalmology

## 2020-03-05 ENCOUNTER — Encounter (INDEPENDENT_AMBULATORY_CARE_PROVIDER_SITE_OTHER): Payer: Self-pay | Admitting: Ophthalmology

## 2020-03-05 ENCOUNTER — Other Ambulatory Visit: Payer: Self-pay

## 2020-03-05 DIAGNOSIS — H353211 Exudative age-related macular degeneration, right eye, with active choroidal neovascularization: Secondary | ICD-10-CM | POA: Diagnosis not present

## 2020-03-05 DIAGNOSIS — H353212 Exudative age-related macular degeneration, right eye, with inactive choroidal neovascularization: Secondary | ICD-10-CM | POA: Diagnosis not present

## 2020-03-05 DIAGNOSIS — H353122 Nonexudative age-related macular degeneration, left eye, intermediate dry stage: Secondary | ICD-10-CM

## 2020-03-05 MED ORDER — AFLIBERCEPT 2MG/0.05ML IZ SOLN FOR KALEIDOSCOPE
2.0000 mg | INTRAVITREAL | Status: AC | PRN
  Administered 2020-03-05: 2 mg via INTRAVITREAL

## 2020-03-05 NOTE — Assessment & Plan Note (Signed)
Chronic CME temporal to fovea, overlies an area of RPE atrophy in the macula.  Nonetheless improved and stable at 8-week follow-up interval.  Repeat injection today Eylea and examination in 8 weeks

## 2020-03-05 NOTE — Progress Notes (Signed)
03/05/2020     CHIEF COMPLAINT Patient presents for Retina Follow Up   HISTORY OF PRESENT ILLNESS: Jeanette Cross is a 86 y.o. female who presents to the clinic today for:   HPI    Retina Follow Up    Patient presents with  Wet AMD.  In right eye.  This started 8 weeks ago.  Severity is mild.  Duration of 8 weeks.  Since onset it is stable.          Comments    8 Week AMD F/U OD, poss Eylea OD  Pt reports intermittent crust in corners of eyes OU. No changes to [place] reported OU. No ocular pain, flashes, or floaters reported OU.       Last edited by Rockie Neighbours, [place] on 03/05/2020  9:48 AM. (History)      Referring physician: Monico Blitz, MD [place],  [place] [id number]  HISTORICAL INFORMATION:   Selected notes from the [place]: No current outpatient medications on file. (Ophthalmic Drugs)   No current facility-administered medications for this visit. (Ophthalmic Drugs)   Current Outpatient Medications (Other)  Medication Sig  . allopurinol (ZYLOPRIM) 100 MG tablet Take 100 mg by mouth daily.  Marland Kitchen levothyroxine (SYNTHROID) 50 MCG tablet Take 50 mcg by mouth daily.  Marland Kitchen lisinopril (ZESTRIL) 2.5 MG tablet Take 2.5 mg by mouth daily.   No current facility-administered medications for this visit. (Other)      REVIEW OF SYSTEMS:    ALLERGIES Allergies  Allergen Reactions  . Novocain [Procaine]   . Tylenol [Acetaminophen]     PAST MEDICAL HISTORY Past Medical History:  Diagnosis Date  . Cancer ([place])   . Hypertension   . Macular degeneration    Past Surgical History:  Procedure Laterality Date  . CATARACT EXTRACTION W/PHACO Bilateral     FAMILY HISTORY Family History  Problem Relation Age of Onset  . Hypertension Sister     SOCIAL HISTORY Social History   Tobacco Use  . Smoking status: Never Smoker  . Smokeless tobacco: Never Used  Substance Use Topics  . Alcohol use: Not on file  . Drug use:  Not on file         OPHTHALMIC EXAM:  Base Eye Exam    Visual Acuity (ETDRS)      Right Left   Dist [place] 20/60 20/40 -1   Dist ph [place] 20/40 NI       Tonometry (Tonopen, 9:49 AM)      Right Left   Pressure 13 14       Pupils      Pupils Dark Light Shape React APD   Right PERRL 4 3 Round Slow None   Left PERRL 4 3 Round Slow None       Visual Fields (Counting fingers)      Left Right    Full Full       Extraocular Movement      Right Left    Full Full       Neuro/Psych    Oriented x3: Yes   Mood/Affect: Normal       Dilation    Right eye: 1.0% Mydriacyl, 2.5% Phenylephrine @ 9:52 AM        Slit Lamp and Fundus Exam    External Exam      Right Left   External Normal Normal       Slit Lamp Exam  Right Left   Lids/Lashes Normal Normal   Conjunctiva/Sclera White and quiet White and quiet   Cornea Clear Clear   Anterior Chamber Deep and quiet Deep and quiet   Iris Round and reactive Round and reactive   Lens Posterior chamber intraocular lens Posterior chamber intraocular lens   Anterior Vitreous Normal Normal       Fundus Exam      Right Left   Posterior Vitreous Posterior vitreous detachment    Disc Normal    C/D Ratio 0.4    Macula Geographic atrophy, Hard drusen, Retinal pigment epithelial mottling, no macular thickening    Vessels Normal    Periphery Normal           IMAGING AND PROCEDURES  Imaging and Procedures for 03/05/20  OCT, Retina - OU - Both Eyes       Right Eye Quality was good. Scan locations included subfoveal. Central Foveal Thickness: 287. Progression has improved. Findings include abnormal foveal contour, intraretinal fluid, cystoid macular edema.   Left Eye Quality was good. Scan locations included subfoveal. Central Foveal Thickness: 288. Progression has been stable.   Notes OD overall improved anatomy on an intravitreal Eylea compared to June 2021         Intravitreal Injection, Pharmacologic Agent - OD -  Right Eye       Time Out 03/05/2020. 10:56 AM. Confirmed correct patient, procedure, site, and patient consented.   Anesthesia Topical anesthesia was used. Anesthetic medications included Akten 3.5%.   Procedure Preparation included Ofloxacin , 10% betadine to eyelids, 5% betadine to ocular surface. A 30 gauge needle was used.   Injection:  2 mg aflibercept Alfonse Flavors) SOLN   NDC: [id number], Lot: [id number]   Route: Intravitreal, Site: Right Eye, Waste: 0 mg  Post-op Post injection exam found visual acuity of at least counting fingers. The patient tolerated the procedure well. There were no complications. The patient received written and verbal post procedure care education. Post injection medications were not given.                 ASSESSMENT/PLAN:  Exudative age-related macular degeneration of right eye with active choroidal neovascularization (HCC) Chronic CME temporal to fovea, overlies an area of RPE atrophy in the macula.  Nonetheless improved and stable at 8-week follow-up interval.  Repeat injection today Eylea and examination in 8 weeks  Intermediate stage nonexudative age-related macular degeneration of left eye The nature of age--related macular degeneration was discussed with the patient as well as the distinction between dry and wet types. Checking an Amsler Grid daily with advice to return immediately should a distortion develop, was given to the patient. The patient 's smoking status now and in the past was determined and advice based on the AREDS study was provided regarding the consumption of antioxidant supplements. AREDS 2 vitamin formulation was recommended. Consumption of dark leafy vegetables and fresh fruits of various colors was recommended. Treatment modalities for wet macular degeneration particularly the use of intravitreal injections of anti-blood vessel growth factors was discussed with the patient. Avastin, Lucentis, and Eylea are the available options.  On occasion, therapy includes the use of photodynamic therapy and thermal laser. Stressed to the patient do not rub eyes.  Patient was advised to check Amsler Grid daily and return immediately if changes are noted. Instructions on using the grid were given to the patient. All patient questions were answered.      ICD-10-CM   1. Exudative age-related macular degeneration of right eye  with active choroidal neovascularization (HCC)  H35.3211 Intravitreal Injection, Pharmacologic Agent - OD - Right Eye    aflibercept (EYLEA) SOLN 2 mg  2. Exudative age-related macular degeneration of right eye with inactive choroidal neovascularization (HCC)  H35.3212 OCT, Retina - OU - Both Eyes  3. Intermediate stage nonexudative age-related macular degeneration of left eye  H35.3122     1.  We will repeat intravitreal Eylea today to maintain, improvement in CNVM, with less CME.  2.  3.  Ophthalmic Meds Ordered this visit:  Meds ordered this encounter  Medications  . aflibercept (EYLEA) SOLN 2 mg       Return in about 8 weeks (around 04/30/2020) for dilate, OD, EYLEA OCT.  Patient Instructions  Patient instructed to contact the office promptly for new onset visual acuity declines or distortions    Explained the diagnoses, plan, and follow up with the patient and they expressed understanding.  Patient expressed understanding of the importance of proper follow up care.   Clent Demark Hamzah Savoca M.D. Diseases & Surgery of the Retina and Vitreous Retina & Diabetic [place] 03/05/20     Abbreviations: M myopia (nearsighted); A astigmatism; H hyperopia (farsighted); P presbyopia; Mrx spectacle prescription;  CTL contact lenses; OD right eye; OS left eye; OU both eyes  XT exotropia; ET esotropia; PEK punctate epithelial keratitis; PEE punctate epithelial erosions; DES dry eye syndrome; MGD meibomian gland dysfunction; ATs artificial tears; PFAT's preservative free artificial tears; [place] nuclear sclerotic  cataract; PSC posterior subcapsular cataract; ERM epi-retinal membrane; PVD posterior vitreous detachment; RD retinal detachment; DM diabetes mellitus; DR diabetic retinopathy; NPDR non-proliferative diabetic retinopathy; PDR proliferative diabetic retinopathy; CSME clinically significant macular edema; DME diabetic macular edema; dbh dot blot hemorrhages; CWS cotton wool spot; POAG primary open angle glaucoma; C/D cup-to-disc ratio; HVF humphrey visual field; GVF goldmann visual field; OCT optical coherence tomography; IOP intraocular pressure; BRVO Branch retinal vein occlusion; CRVO central retinal vein occlusion; CRAO central retinal artery occlusion; BRAO branch retinal artery occlusion; RT retinal tear; SB scleral buckle; PPV pars plana vitrectomy; VH Vitreous hemorrhage; PRP panretinal laser photocoagulation; IVK intravitreal kenalog; VMT vitreomacular traction; MH Macular hole;  NVD neovascularization of the disc; NVE neovascularization elsewhere; AREDS age related eye disease study; ARMD age related macular degeneration; POAG primary open angle glaucoma; EBMD epithelial/anterior basement membrane dystrophy; ACIOL anterior chamber intraocular lens; IOL intraocular lens; PCIOL posterior chamber intraocular lens; Phaco/IOL phacoemulsification with intraocular lens placement; [place] photorefractive keratectomy; LASIK laser assisted in situ keratomileusis; HTN hypertension; DM diabetes mellitus; COPD chronic obstructive pulmonary disease

## 2020-03-05 NOTE — Assessment & Plan Note (Signed)

## 2020-03-05 NOTE — Patient Instructions (Signed)
Patient instructed to contact the office promptly for new onset visual acuity declines or distortions 

## 2020-03-20 DIAGNOSIS — L723 Sebaceous cyst: Secondary | ICD-10-CM | POA: Diagnosis not present

## 2020-03-20 DIAGNOSIS — Z79899 Other long term (current) drug therapy: Secondary | ICD-10-CM | POA: Diagnosis not present

## 2020-03-20 DIAGNOSIS — Z23 Encounter for immunization: Secondary | ICD-10-CM | POA: Diagnosis not present

## 2020-04-02 DIAGNOSIS — I10 Essential (primary) hypertension: Secondary | ICD-10-CM | POA: Diagnosis not present

## 2020-04-05 DIAGNOSIS — E2839 Other primary ovarian failure: Secondary | ICD-10-CM | POA: Diagnosis not present

## 2020-04-12 DIAGNOSIS — M79671 Pain in right foot: Secondary | ICD-10-CM | POA: Diagnosis not present

## 2020-04-12 DIAGNOSIS — M10071 Idiopathic gout, right ankle and foot: Secondary | ICD-10-CM | POA: Diagnosis not present

## 2020-04-16 DIAGNOSIS — M109 Gout, unspecified: Secondary | ICD-10-CM | POA: Diagnosis not present

## 2020-04-16 DIAGNOSIS — Z299 Encounter for prophylactic measures, unspecified: Secondary | ICD-10-CM | POA: Diagnosis not present

## 2020-04-16 DIAGNOSIS — I10 Essential (primary) hypertension: Secondary | ICD-10-CM | POA: Diagnosis not present

## 2020-04-16 DIAGNOSIS — N184 Chronic kidney disease, stage 4 (severe): Secondary | ICD-10-CM | POA: Diagnosis not present

## 2020-04-16 DIAGNOSIS — H353290 Exudative age-related macular degeneration, unspecified eye, stage unspecified: Secondary | ICD-10-CM | POA: Diagnosis not present

## 2020-04-24 DIAGNOSIS — C44329 Squamous cell carcinoma of skin of other parts of face: Secondary | ICD-10-CM | POA: Diagnosis not present

## 2020-04-29 ENCOUNTER — Encounter (INDEPENDENT_AMBULATORY_CARE_PROVIDER_SITE_OTHER): Payer: Medicare Other | Admitting: Ophthalmology

## 2020-04-30 ENCOUNTER — Encounter (INDEPENDENT_AMBULATORY_CARE_PROVIDER_SITE_OTHER): Payer: Medicare Other | Admitting: Ophthalmology

## 2020-05-02 DIAGNOSIS — I10 Essential (primary) hypertension: Secondary | ICD-10-CM | POA: Diagnosis not present

## 2020-05-06 ENCOUNTER — Encounter (INDEPENDENT_AMBULATORY_CARE_PROVIDER_SITE_OTHER): Payer: Medicare Other | Admitting: Ophthalmology

## 2020-05-07 ENCOUNTER — Encounter (INDEPENDENT_AMBULATORY_CARE_PROVIDER_SITE_OTHER): Payer: Self-pay | Admitting: Ophthalmology

## 2020-05-07 ENCOUNTER — Other Ambulatory Visit: Payer: Self-pay

## 2020-05-07 ENCOUNTER — Ambulatory Visit (INDEPENDENT_AMBULATORY_CARE_PROVIDER_SITE_OTHER): Payer: Medicare Other | Admitting: Ophthalmology

## 2020-05-07 DIAGNOSIS — H353211 Exudative age-related macular degeneration, right eye, with active choroidal neovascularization: Secondary | ICD-10-CM

## 2020-05-07 DIAGNOSIS — H353122 Nonexudative age-related macular degeneration, left eye, intermediate dry stage: Secondary | ICD-10-CM | POA: Diagnosis not present

## 2020-05-07 MED ORDER — AFLIBERCEPT 2MG/0.05ML IZ SOLN FOR KALEIDOSCOPE
2.0000 mg | INTRAVITREAL | Status: AC | PRN
  Administered 2020-05-07: 2 mg via INTRAVITREAL

## 2020-05-07 NOTE — Assessment & Plan Note (Signed)
The nature of age--related macular degeneration was discussed with the patient as well as the distinction between dry and wet types. Checking an Amsler Grid daily with advice to return immediately should a distortion develop, was given to the patient. The patient 's smoking status now and in the past was determined and advice based on the AREDS study was provided regarding the consumption of antioxidant supplements. AREDS 2 vitamin formulation was recommended. Consumption of dark leafy vegetables and fresh fruits of various colors was recommended. Treatment modalities for wet macular degeneration particularly the use of intravitreal injections of anti-blood vessel growth factors was discussed with the patient. Avastin, Lucentis, and Eylea are the available options. On occasion, therapy includes the use of photodynamic therapy and thermal laser. Stressed to the patient do not rub eyes.  Patient was advised to check Amsler Grid daily and return immediately if changes are noted. Instructions on using the grid were given to the patient. All patient questions were answered.  Stable will observe

## 2020-05-07 NOTE — Assessment & Plan Note (Addendum)
The nature of wet macular degeneration was discussed with the patient.  Forms of therapy reviewed include the use of Anti-VEGF medications injected painlessly into the eye, as well as other possible treatment modalities, including thermal laser therapy. Fellow eye involvement and risks were discussed with the patient. Upon the finding of wet age related macular degeneration, treatment will be offered. The treatment regimen is on a treat as needed basis with the intent to treat if necessary and extend interval of exams when possible. On average 1 out of 6 patients do not need lifetime therapy. However, the risk of recurrent disease is high for a lifetime.  Initially monthly, then periodic, examinations and evaluations will determine whether the next treatment is required on the day of the examination.  OD chronic active yet stable, CME overlies region of geographic atrophy.  Treatment today at 8-week follow-up interval, repeat examination in 8 weeks

## 2020-05-07 NOTE — Progress Notes (Signed)
05/07/2020     CHIEF COMPLAINT Patient presents for Retina Follow Up (8 WK FU OD, POSS EYLEA OD///Pt reports stable vision OD, no new F/F OD, no pain or pressure OD.)   HISTORY OF PRESENT ILLNESS: Jeanette Cross is a 87 y.o. female who presents to the clinic today for:   HPI    Retina Follow Up    Patient presents with  Wet AMD.  In right eye.  This started 8 weeks ago.  Duration of 8 weeks. Additional comments: 8 WK FU OD, POSS EYLEA OD   Pt reports stable vision OD, no new F/F OD, no pain or pressure OD.       Last edited by Nichola Sizer D on 05/07/2020  2:34 PM. (History)      Referring physician: Monico Blitz, MD [place],  [place] [id number]  HISTORICAL INFORMATION:   Selected notes from the [place]: No current outpatient medications on file. (Ophthalmic Drugs)   No current facility-administered medications for this visit. (Ophthalmic Drugs)   Current Outpatient Medications (Other)  Medication Sig  . allopurinol (ZYLOPRIM) 100 MG tablet Take 100 mg by mouth daily.  Marland Kitchen levothyroxine (SYNTHROID) 50 MCG tablet Take 50 mcg by mouth daily.  Marland Kitchen lisinopril (ZESTRIL) 2.5 MG tablet Take 2.5 mg by mouth daily.   No current facility-administered medications for this visit. (Other)      REVIEW OF SYSTEMS:    ALLERGIES Allergies  Allergen Reactions  . Novocain [Procaine]   . Tylenol [Acetaminophen]     PAST MEDICAL HISTORY Past Medical History:  Diagnosis Date  . Cancer ([place])   . Hypertension   . Macular degeneration    Past Surgical History:  Procedure Laterality Date  . CATARACT EXTRACTION W/PHACO Bilateral     FAMILY HISTORY Family History  Problem Relation Age of Onset  . Hypertension Sister     SOCIAL HISTORY Social History   Tobacco Use  . Smoking status: Never Smoker  . Smokeless tobacco: Never Used         OPHTHALMIC EXAM:  Base Eye Exam    Visual Acuity (ETDRS)      Right Left    Dist [place] 20/60 -2 20/30 -1   Dist ph [place] 20/40 NI       Tonometry (Tonopen, 2:39 PM)      Right Left   Pressure 13 12       Pupils      Pupils Dark Light Shape React APD   Right PERRL 4 3 Round Slow None   Left PERRL 4 3 Round Slow None       Visual Fields (Counting fingers)      Left Right    Full Full       Extraocular Movement      Right Left    Full Full       Neuro/Psych    Oriented x3: Yes   Mood/Affect: Normal       Dilation    Right eye: 1.0% Mydriacyl, 2.5% Phenylephrine @ 2:39 PM        Slit Lamp and Fundus Exam    External Exam      Right Left   External Normal Normal       Slit Lamp Exam      Right Left   Lids/Lashes Normal Normal   Conjunctiva/Sclera White and quiet White and quiet   Cornea Clear Clear  Anterior Chamber Deep and quiet Deep and quiet   Iris Round and reactive Round and reactive   Lens Posterior chamber intraocular lens Posterior chamber intraocular lens   Anterior Vitreous Normal Normal       Fundus Exam      Right Left   Posterior Vitreous Posterior vitreous detachment    Disc Normal    C/D Ratio 0.4    Macula Geographic atrophy, Hard drusen, Retinal pigment epithelial mottling, no macular thickening    Vessels Normal    Periphery Normal           IMAGING AND PROCEDURES  Imaging and Procedures for 05/07/20  OCT, Retina - OU - Both Eyes       Right Eye Quality was good. Scan locations included subfoveal. Central Foveal Thickness: 289. Progression has improved. Findings include abnormal foveal contour, intraretinal fluid, cystoid macular edema.   Left Eye Quality was good. Scan locations included subfoveal. Central Foveal Thickness: 287. Progression has been stable. Findings include retinal drusen , no SRF, no IRF.   Notes OD overall improved anatomy on an intravitreal Eylea compared to June 2021, yet was still active CME, stable however at 8-week follow-up         Intravitreal Injection, Pharmacologic  Agent - OD - Right Eye       Time Out 05/07/2020. 3:19 PM. Confirmed correct patient, procedure, site, and patient consented.   Anesthesia Topical anesthesia was used. Anesthetic medications included Akten 3.5%.   Procedure Preparation included Ofloxacin , 10% betadine to eyelids, 5% betadine to ocular surface. A 30 gauge needle was used.   Injection:  2 mg aflibercept Alfonse Flavors) SOLN   NDC: [id number], Lot: [id number]   Route: Intravitreal, Site: Right Eye, Waste: 0 mg  Post-op Post injection exam found visual acuity of at least counting fingers. The patient tolerated the procedure well. There were no complications. The patient received written and verbal post procedure care education. Post injection medications were not given.                 ASSESSMENT/PLAN:  Exudative age-related macular degeneration of right eye with active choroidal neovascularization (HCC) The nature of wet macular degeneration was discussed with the patient.  Forms of therapy reviewed include the use of Anti-VEGF medications injected painlessly into the eye, as well as other possible treatment modalities, including thermal laser therapy. Fellow eye involvement and risks were discussed with the patient. Upon the finding of wet age related macular degeneration, treatment will be offered. The treatment regimen is on a treat as needed basis with the intent to treat if necessary and extend interval of exams when possible. On average 1 out of 6 patients do not need lifetime therapy. However, the risk of recurrent disease is high for a lifetime.  Initially monthly, then periodic, examinations and evaluations will determine whether the next treatment is required on the day of the examination.  OD chronic active yet stable, CME overlies region of geographic atrophy.  Treatment today at 8-week follow-up interval, repeat examination in 8 weeks  Intermediate stage nonexudative age-related macular degeneration of left  eye The nature of age--related macular degeneration was discussed with the patient as well as the distinction between dry and wet types. Checking an Amsler Grid daily with advice to return immediately should a distortion develop, was given to the patient. The patient 's smoking status now and in the past was determined and advice based on the AREDS study was provided regarding the consumption of antioxidant  supplements. AREDS 2 vitamin formulation was recommended. Consumption of dark leafy vegetables and fresh fruits of various colors was recommended. Treatment modalities for wet macular degeneration particularly the use of intravitreal injections of anti-blood vessel growth factors was discussed with the patient. Avastin, Lucentis, and Eylea are the available options. On occasion, therapy includes the use of photodynamic therapy and thermal laser. Stressed to the patient do not rub eyes.  Patient was advised to check Amsler Grid daily and return immediately if changes are noted. Instructions on using the grid were given to the patient. All patient questions were answered.  Stable will observe      ICD-10-CM   1. Exudative age-related macular degeneration of right eye with active choroidal neovascularization (HCC)  H35.3211 OCT, Retina - OU - Both Eyes    Intravitreal Injection, Pharmacologic Agent - OD - Right Eye    aflibercept (EYLEA) SOLN 2 mg    CANCELED: Intravitreal Injection, Pharmacologic Agent - OS - Left Eye  2. Intermediate stage nonexudative age-related macular degeneration of left eye  H35.3122     1.  Repeat injection OD today to maintain stable findings of macular region CME from CNVM  2.  Follow-up examination dilate OU next  3.  Likely injection intravitreal Eylea OD next  Ophthalmic Meds Ordered this visit:  Meds ordered this encounter  Medications  . aflibercept (EYLEA) SOLN 2 mg       Return in about 8 weeks (around 07/02/2020) for DILATE OU, COLOR FP, EYLEA OCT,  OD.  There are no Patient Instructions on file for this visit.   Explained the diagnoses, plan, and follow up with the patient and they expressed understanding.  Patient expressed understanding of the importance of proper follow up care.   Clent Demark Elber Galyean M.D. Diseases & Surgery of the Retina and Vitreous Retina & Diabetic [place] 05/07/20     Abbreviations: M myopia (nearsighted); A astigmatism; H hyperopia (farsighted); P presbyopia; Mrx spectacle prescription;  CTL contact lenses; OD right eye; OS left eye; OU both eyes  XT exotropia; ET esotropia; PEK punctate epithelial keratitis; PEE punctate epithelial erosions; DES dry eye syndrome; MGD meibomian gland dysfunction; ATs artificial tears; PFAT's preservative free artificial tears; [place] nuclear sclerotic cataract; PSC posterior subcapsular cataract; ERM epi-retinal membrane; PVD posterior vitreous detachment; RD retinal detachment; DM diabetes mellitus; DR diabetic retinopathy; NPDR non-proliferative diabetic retinopathy; PDR proliferative diabetic retinopathy; CSME clinically significant macular edema; DME diabetic macular edema; dbh dot blot hemorrhages; CWS cotton wool spot; POAG primary open angle glaucoma; C/D cup-to-disc ratio; HVF humphrey visual field; GVF goldmann visual field; OCT optical coherence tomography; IOP intraocular pressure; BRVO Branch retinal vein occlusion; CRVO central retinal vein occlusion; CRAO central retinal artery occlusion; BRAO branch retinal artery occlusion; RT retinal tear; SB scleral buckle; PPV pars plana vitrectomy; VH Vitreous hemorrhage; PRP panretinal laser photocoagulation; IVK intravitreal kenalog; VMT vitreomacular traction; MH Macular hole;  NVD neovascularization of the disc; NVE neovascularization elsewhere; AREDS age related eye disease study; ARMD age related macular degeneration; POAG primary open angle glaucoma; EBMD epithelial/anterior basement membrane dystrophy; ACIOL anterior chamber  intraocular lens; IOL intraocular lens; PCIOL posterior chamber intraocular lens; Phaco/IOL phacoemulsification with intraocular lens placement; [place] photorefractive keratectomy; LASIK laser assisted in situ keratomileusis; HTN hypertension; DM diabetes mellitus; COPD chronic obstructive pulmonary disease

## 2020-06-03 DIAGNOSIS — I10 Essential (primary) hypertension: Secondary | ICD-10-CM | POA: Diagnosis not present

## 2020-06-24 DIAGNOSIS — Z6823 Body mass index (BMI) 23.0-23.9, adult: Secondary | ICD-10-CM | POA: Diagnosis not present

## 2020-06-24 DIAGNOSIS — N184 Chronic kidney disease, stage 4 (severe): Secondary | ICD-10-CM | POA: Diagnosis not present

## 2020-06-24 DIAGNOSIS — N2581 Secondary hyperparathyroidism of renal origin: Secondary | ICD-10-CM | POA: Diagnosis not present

## 2020-06-24 DIAGNOSIS — L84 Corns and callosities: Secondary | ICD-10-CM | POA: Diagnosis not present

## 2020-06-24 DIAGNOSIS — Z299 Encounter for prophylactic measures, unspecified: Secondary | ICD-10-CM | POA: Diagnosis not present

## 2020-06-24 DIAGNOSIS — Z789 Other specified health status: Secondary | ICD-10-CM | POA: Diagnosis not present

## 2020-06-24 DIAGNOSIS — I10 Essential (primary) hypertension: Secondary | ICD-10-CM | POA: Diagnosis not present

## 2020-07-01 DIAGNOSIS — I10 Essential (primary) hypertension: Secondary | ICD-10-CM | POA: Diagnosis not present

## 2020-07-02 ENCOUNTER — Ambulatory Visit (INDEPENDENT_AMBULATORY_CARE_PROVIDER_SITE_OTHER): Payer: Medicare Other | Admitting: Ophthalmology

## 2020-07-02 ENCOUNTER — Other Ambulatory Visit: Payer: Self-pay

## 2020-07-02 ENCOUNTER — Encounter (INDEPENDENT_AMBULATORY_CARE_PROVIDER_SITE_OTHER): Payer: Self-pay | Admitting: Ophthalmology

## 2020-07-02 DIAGNOSIS — H43813 Vitreous degeneration, bilateral: Secondary | ICD-10-CM | POA: Diagnosis not present

## 2020-07-02 DIAGNOSIS — H353122 Nonexudative age-related macular degeneration, left eye, intermediate dry stage: Secondary | ICD-10-CM | POA: Diagnosis not present

## 2020-07-02 DIAGNOSIS — H353211 Exudative age-related macular degeneration, right eye, with active choroidal neovascularization: Secondary | ICD-10-CM | POA: Diagnosis not present

## 2020-07-02 MED ORDER — AFLIBERCEPT 2MG/0.05ML IZ SOLN FOR KALEIDOSCOPE
2.0000 mg | INTRAVITREAL | Status: AC | PRN
  Administered 2020-07-02: 2 mg via INTRAVITREAL

## 2020-07-02 NOTE — Patient Instructions (Signed)
Patient instructed to contact the office promptly for new onset visual acuity declines or distortions 

## 2020-07-02 NOTE — Assessment & Plan Note (Signed)
No signs of CNVM OS today

## 2020-07-02 NOTE — Assessment & Plan Note (Signed)
OD improved and stable at 8-week follow-up today post Eylea injection. Will need repeat injection today as recently active as recent as January 2022

## 2020-07-02 NOTE — Progress Notes (Signed)
07/02/2020     CHIEF COMPLAINT Patient presents for Retina Follow Up (8 Week Wet AMD f/u. Possible Eylea OD. OCT and FP/Pt states vision is about the same. Denies new FOL and floaters. Pt states she bought a new lamp and makes her eyes water.)   HISTORY OF PRESENT ILLNESS: Jeanette Cross is a 87 y.o. female who presents to the clinic today for:   HPI    Retina Follow Up    Patient presents with  Wet AMD.  In right eye.  Severity is moderate.  Duration of 8 weeks.  Since onset it is stable.  I, the attending physician,  performed the HPI with the patient and updated documentation appropriately. Additional comments: 8 Week Wet AMD f/u. Possible Eylea OD. OCT and FP Pt states vision is about the same. Denies new FOL and floaters. Pt states she bought a new lamp and makes her eyes water.       Last edited by Tilda Franco on 07/02/2020  1:45 PM. (History)      Referring physician: Monico Blitz, MD [place],  [place] [id number]  HISTORICAL INFORMATION:   Selected notes from the [facility]: No current outpatient medications on file. (Ophthalmic Drugs)   No current facility-administered medications for this visit. (Ophthalmic Drugs)   Current Outpatient Medications (Other)  Medication Sig  . allopurinol (ZYLOPRIM) 100 MG tablet Take 100 mg by mouth daily.  Marland Kitchen levothyroxine (SYNTHROID) 50 MCG tablet Take 50 mcg by mouth daily.  Marland Kitchen lisinopril (ZESTRIL) 2.5 MG tablet Take 2.5 mg by mouth daily.   No current facility-administered medications for this visit. (Other)      REVIEW OF SYSTEMS:    ALLERGIES Allergies  Allergen Reactions  . Novocain [Procaine]   . Tylenol [Acetaminophen]     PAST MEDICAL HISTORY Past Medical History:  Diagnosis Date  . Cancer ([place])   . Hypertension   . Macular degeneration    Past Surgical History:  Procedure Laterality Date  . CATARACT EXTRACTION W/PHACO Bilateral     FAMILY  HISTORY Family History  Problem Relation Age of Onset  . Hypertension Sister     SOCIAL HISTORY Social History   Tobacco Use  . Smoking status: Never Smoker  . Smokeless tobacco: Never Used         OPHTHALMIC EXAM:  Base Eye Exam    Visual Acuity (Snellen - Linear)      Right Left   Dist [place] 20/50 -2 20/40 +1   Dist ph [place] 20/30 -2 20/30 -2       Tonometry (Tonopen, 1:51 PM)      Right Left   Pressure 10 11       Pupils      Pupils Dark Light Shape React APD   Right PERRL 2 2 Round Minimal None   Left PERRL 3 3 Round Minimal None       Visual Fields (Counting fingers)      Left Right    Full Full       Neuro/Psych    Oriented x3: Yes   Mood/Affect: Normal       Dilation    Both eyes: 1.0% Mydriacyl, 2.5% Phenylephrine @ 1:51 PM        Slit Lamp and Fundus Exam    External Exam      Right Left   External Normal Normal       Slit  Lamp Exam      Right Left   Lids/Lashes Normal Normal   Conjunctiva/Sclera White and quiet White and quiet   Cornea Clear Clear   Anterior Chamber Deep and quiet Deep and quiet   Iris Round and reactive Round and reactive   Lens Posterior chamber intraocular lens Posterior chamber intraocular lens   Anterior Vitreous Normal Normal       Fundus Exam      Right Left   Posterior Vitreous Posterior vitreous detachment Posterior vitreous detachment   Disc Normal Normal   C/D Ratio 0.4 0.3-0.4   Macula Geographic atrophy , Hard drusen, Retinal pigment epithelial mottling, no macular thickening Retinal pigment epithelial mottling, Early age related macular degeneration   Vessels Normal Normal   Periphery Normal Normal          IMAGING AND PROCEDURES  Imaging and Procedures for 07/02/20  OCT, Retina - OU - Both Eyes       Right Eye Quality was good. Scan locations included subfoveal. Central Foveal Thickness: 257. Progression has improved. Findings include abnormal foveal contour, cystoid macular edema, subretinal  scarring, retinal drusen .   Left Eye Quality was good. Scan locations included subfoveal. Central Foveal Thickness: 285. Progression has been stable. Findings include retinal drusen , no SRF, no IRF.   Notes OD overall improved anatomy on an intravitreal Eylea compared to January 2022, with much less CME intraretinal fluid   OS no signs of CNVM        Intravitreal Injection, Pharmacologic Agent - OD - Right Eye       Time Out 07/02/2020. 2:44 PM. Confirmed correct patient, procedure, site, and patient consented.   Anesthesia Topical anesthesia was used. Anesthetic medications included Akten 3.5%.   Procedure Preparation included Ofloxacin , 10% betadine to eyelids, 5% betadine to ocular surface. A 30 gauge needle was used.   Injection:  2 mg aflibercept Alfonse Flavors) SOLN   NDC: [id number], Lot: [id number]   Route: Intravitreal, Site: Right Eye, Waste: 0 mg  Post-op Post injection exam found visual acuity of at least counting fingers. The patient tolerated the procedure well. There were no complications. The patient received written and verbal post procedure care education. Post injection medications were not given.                 ASSESSMENT/PLAN:  Exudative age-related macular degeneration of right eye with active choroidal neovascularization (HCC) OD improved and stable at 8-week follow-up today post Eylea injection. Will need repeat injection today as recently active as recent as January 2022  Intermediate stage nonexudative age-related macular degeneration of left eye No signs of CNVM OS today  Posterior vitreous detachment of both eyes   The nature of posterior vitreous detachment was discussed with the patient as well as its physiology, its age prevalence, and its possible implication regarding retinal breaks and detachment.  An informational brochure was given to the patient.  All the patient's questions were answered.  The patient was asked to return if new or  different flashes or floaters develops.   Patient was instructed to contact office immediately if any changes were noticed. I explained to the patient that vitreous inside the eye is similar to jello inside a bowl. As the jello melts it can start to pull away from the bowl, similarly the vitreous throughout our lives can begin to pull away from the retina. That process is called a posterior vitreous detachment. In some cases, the vitreous can tug hard enough on  the retina to form a retinal tear. I discussed with the patient the signs and symptoms of a retinal detachment.  Do not rub the eye.      ICD-10-CM   1. Exudative age-related macular degeneration of right eye with active choroidal neovascularization (HCC)  H35.3211 OCT, Retina - OU - Both Eyes    Intravitreal Injection, Pharmacologic Agent - OD - Right Eye    aflibercept (EYLEA) SOLN 2 mg  2. Intermediate stage nonexudative age-related macular degeneration of left eye  H35.3122   3. Posterior vitreous detachment of both eyes  H43.813     1. Continued stabilization and improvement of macular findings right eye with good acuity secondary to treatment with Eylea for CNVM, at 8-week interval today. Repeat injection today  2. Follow up OD next in dilate in 8 weeks  3. OS no signs of CNVM observe  Ophthalmic Meds Ordered this visit:  Meds ordered this encounter  Medications  . aflibercept (EYLEA) SOLN 2 mg       Return in about 8 weeks (around 08/27/2020) for dilate, OD, EYLEA OCT.  Patient Instructions  Patient instructed to contact the office promptly for new onset visual acuity declines or distortions    Explained the diagnoses, plan, and follow up with the patient and they expressed understanding.  Patient expressed understanding of the importance of proper follow up care.   Clent Demark Rankin M.D. Diseases & Surgery of the Retina and Vitreous Retina & Diabetic [place] 07/02/20     Abbreviations: M myopia (nearsighted);  A astigmatism; H hyperopia (farsighted); P presbyopia; Mrx spectacle prescription;  CTL contact lenses; OD right eye; OS left eye; OU both eyes  XT exotropia; ET esotropia; PEK punctate epithelial keratitis; PEE punctate epithelial erosions; DES dry eye syndrome; MGD meibomian gland dysfunction; ATs artificial tears; PFAT's preservative free artificial tears; [place] nuclear sclerotic cataract; PSC posterior subcapsular cataract; ERM epi-retinal membrane; PVD posterior vitreous detachment; RD retinal detachment; DM diabetes mellitus; DR diabetic retinopathy; NPDR non-proliferative diabetic retinopathy; PDR proliferative diabetic retinopathy; CSME clinically significant macular edema; DME diabetic macular edema; dbh dot blot hemorrhages; CWS cotton wool spot; POAG primary open angle glaucoma; C/D cup-to-disc ratio; HVF humphrey visual field; GVF goldmann visual field; OCT optical coherence tomography; IOP intraocular pressure; BRVO Branch retinal vein occlusion; CRVO central retinal vein occlusion; CRAO central retinal artery occlusion; BRAO branch retinal artery occlusion; RT retinal tear; SB scleral buckle; PPV pars plana vitrectomy; VH Vitreous hemorrhage; PRP panretinal laser photocoagulation; IVK intravitreal kenalog; VMT vitreomacular traction; MH Macular hole;  NVD neovascularization of the disc; NVE neovascularization elsewhere; AREDS age related eye disease study; ARMD age related macular degeneration; POAG primary open angle glaucoma; EBMD epithelial/anterior basement membrane dystrophy; ACIOL anterior chamber intraocular lens; IOL intraocular lens; PCIOL posterior chamber intraocular lens; Phaco/IOL phacoemulsification with intraocular lens placement; [place] photorefractive keratectomy; LASIK laser assisted in situ keratomileusis; HTN hypertension; DM diabetes mellitus; COPD chronic obstructive pulmonary disease

## 2020-07-02 NOTE — Assessment & Plan Note (Signed)

## 2020-08-01 DIAGNOSIS — I10 Essential (primary) hypertension: Secondary | ICD-10-CM | POA: Diagnosis not present

## 2020-08-30 DIAGNOSIS — I10 Essential (primary) hypertension: Secondary | ICD-10-CM | POA: Diagnosis not present

## 2020-09-03 ENCOUNTER — Encounter (INDEPENDENT_AMBULATORY_CARE_PROVIDER_SITE_OTHER): Payer: Medicare Other | Admitting: Ophthalmology

## 2020-09-05 ENCOUNTER — Other Ambulatory Visit: Payer: Self-pay

## 2020-09-05 ENCOUNTER — Ambulatory Visit (INDEPENDENT_AMBULATORY_CARE_PROVIDER_SITE_OTHER): Payer: Medicare Other | Admitting: Ophthalmology

## 2020-09-05 ENCOUNTER — Encounter (INDEPENDENT_AMBULATORY_CARE_PROVIDER_SITE_OTHER): Payer: Self-pay | Admitting: Ophthalmology

## 2020-09-05 DIAGNOSIS — H353211 Exudative age-related macular degeneration, right eye, with active choroidal neovascularization: Secondary | ICD-10-CM

## 2020-09-05 MED ORDER — AFLIBERCEPT 2MG/0.05ML IZ SOLN FOR KALEIDOSCOPE
2.0000 mg | INTRAVITREAL | Status: AC | PRN
  Administered 2020-09-05: 2 mg via INTRAVITREAL

## 2020-09-05 NOTE — Progress Notes (Signed)
09/05/2020     CHIEF COMPLAINT Patient presents for Retina Follow Up (9 Wk F/U OD, poss Eylea OD//Pt c/o itching OU. Pt denies noticeable changes to [place] OU since last visit. Pt denies ocular pain, flashes of light, or floaters OU. //)   HISTORY OF PRESENT ILLNESS: Jeanette Cross is a 87 y.o. female who presents to the clinic today for:   HPI    Retina Follow Up    Diagnosis: Wet AMD   Laterality: right eye   Onset: 9 weeks ago   Severity: mild   Duration: 9 weeks   Course: stable   Comments: 9 Wk F/U OD, poss Eylea OD  Pt c/o itching OU. Pt denies noticeable changes to [place] OU since last visit. Pt denies ocular pain, flashes of light, or floaters OU.          Last edited by Rockie Neighbours, [place] on 09/05/2020  1:16 PM. (History)      Referring physician: Monico Blitz, MD [place],  [place] [address]  HISTORICAL INFORMATION:   Selected notes from the [facility]: No current outpatient medications on file. (Ophthalmic Drugs)   No current facility-administered medications for this visit. (Ophthalmic Drugs)   Current Outpatient Medications (Other)  Medication Sig  . allopurinol (ZYLOPRIM) 100 MG tablet Take 100 mg by mouth daily.  Marland Kitchen levothyroxine (SYNTHROID) 50 MCG tablet Take 50 mcg by mouth daily.  Marland Kitchen lisinopril (ZESTRIL) 2.5 MG tablet Take 2.5 mg by mouth daily.   No current facility-administered medications for this visit. (Other)      REVIEW OF SYSTEMS:    ALLERGIES Allergies  Allergen Reactions  . Novocain [Procaine]   . Tylenol [Acetaminophen]     PAST MEDICAL HISTORY Past Medical History:  Diagnosis Date  . Cancer ([place])   . Hypertension   . Macular degeneration    Past Surgical History:  Procedure Laterality Date  . CATARACT EXTRACTION W/PHACO Bilateral     FAMILY HISTORY Family History  Problem Relation Age of Onset  . Hypertension Sister     SOCIAL HISTORY Social History   Tobacco Use  .  Smoking status: Never Smoker  . Smokeless tobacco: Never Used         OPHTHALMIC EXAM: Base Eye Exam    Visual Acuity (ETDRS)      Right Left   Dist [place] 20/40 -2 20/40 +1   Dist ph  20/40 +1 NI       Tonometry (Tonopen, 1:21 PM)      Right Left   Pressure 08 07       Pupils      Dark Light Shape React APD   Right 3 3 Round Minimal None   Left 4 4 Round Minimal None       Visual Fields (Counting fingers)      Left Right    Full Full       Extraocular Movement      Right Left    Full Full       Neuro/Psych    Oriented x3: Yes   Mood/Affect: Normal       Dilation    Right eye: 1.0% Mydriacyl, 2.5% Phenylephrine @ 1:21 PM        Slit Lamp and Fundus Exam    External Exam      Right Left   External Normal Normal       Slit Lamp Exam  Right Left   Lids/Lashes Normal Normal   Conjunctiva/Sclera White and quiet White and quiet   Cornea Clear Clear   Anterior Chamber Deep and quiet Deep and quiet   Iris Round and reactive Round and reactive   Lens Posterior chamber intraocular lens Posterior chamber intraocular lens   Anterior Vitreous Normal Normal       Fundus Exam      Right Left   Posterior Vitreous Posterior vitreous detachment    Disc Normal    C/D Ratio 0.4    Macula Geographic atrophy , Hard drusen, Retinal pigment epithelial mottling, no macular thickening    Vessels Normal    Periphery Normal           IMAGING AND PROCEDURES  Imaging and Procedures for 09/05/20  OCT, Retina - OU - Both Eyes       Right Eye Quality was good. Scan locations included subfoveal. Central Foveal Thickness: 291. Progression has improved. Findings include abnormal foveal contour, cystoid macular edema, subretinal scarring, retinal drusen .   Left Eye Quality was good. Scan locations included subfoveal. Central Foveal Thickness: 288. Progression has been stable. Findings include retinal drusen , no SRF, no IRF.   Notes OD overall improved anatomy  on an intravitreal Eylea compared to January 2022, yet slightly increased CME intraretinal fluid as compared to March 2022, repeat injection Eylea today  OS no signs of CNVM        Intravitreal Injection, Pharmacologic Agent - OD - Right Eye       Time Out 09/05/2020. 2:01 PM. Confirmed correct patient, procedure, site, and patient consented.   Anesthesia Topical anesthesia was used. Anesthetic medications included Akten 3.5%.   Procedure Preparation included Ofloxacin , 10% betadine to eyelids, 5% betadine to ocular surface. A 30 gauge needle was used.   Injection:  2 mg aflibercept Alfonse Flavors) SOLN   NDC: [id number], Lot: [id number]   Route: Intravitreal, Site: Right Eye, Waste: 0 mg  Post-op Post injection exam found visual acuity of at least counting fingers. The patient tolerated the procedure well. There were no complications. The patient received written and verbal post procedure care education. Post injection medications were not given.                 ASSESSMENT/PLAN:  Exudative age-related macular degeneration of right eye with active choroidal neovascularization (HCC) Wet ARMD OD chronically active, will need to repeat intravitreal Eylea OD today to calm the intraretinal fluid currently at 8-week follow-up, repeat today and examination next in 7 weeks      ICD-10-CM   1. 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  OD, with chronic recurrent CME over the lying area of atrophy as signifying CNVM activity.  Repeat today at 8-week interval intravitreal Eylea and follow-up next in 7 weeks  2.  OS no signs of active CNVM  3.  Ophthalmic Meds Ordered this visit:  Meds ordered this encounter  Medications  . aflibercept (EYLEA) SOLN 2 mg       Return in about  7 weeks (around 10/24/2020) for dilate, OD, EYLEA OCT.  There are no Patient Instructions on file for this visit.   Explained the diagnoses, plan, and follow up with the patient and they expressed understanding.  Patient expressed  understanding of the importance of proper follow up care.   Clent Demark Kippy Gohman M.D. Diseases & Surgery of the Retina and Vitreous Retina & Diabetic [place] 09/05/20     Abbreviations: M myopia (nearsighted); A astigmatism; H hyperopia (farsighted); P presbyopia; Mrx spectacle prescription;  CTL contact lenses; OD right eye; OS left eye; OU both eyes  XT exotropia; ET esotropia; PEK punctate epithelial keratitis; PEE punctate epithelial erosions; DES dry eye syndrome; MGD meibomian gland dysfunction; ATs artificial tears; PFAT's preservative free artificial tears; [place] nuclear sclerotic cataract; PSC posterior subcapsular cataract; ERM epi-retinal membrane; PVD posterior vitreous detachment; RD retinal detachment; DM diabetes mellitus; DR diabetic retinopathy; NPDR non-proliferative diabetic retinopathy; PDR proliferative diabetic retinopathy; CSME clinically significant macular edema; DME diabetic macular edema; dbh dot blot hemorrhages; CWS cotton wool spot; POAG primary open angle glaucoma; C/D cup-to-disc ratio; HVF humphrey visual field; GVF goldmann visual field; OCT optical coherence tomography; IOP intraocular pressure; BRVO Branch retinal vein occlusion; CRVO central retinal vein occlusion; CRAO central retinal artery occlusion; BRAO branch retinal artery occlusion; RT retinal tear; SB scleral buckle; PPV pars plana vitrectomy; VH Vitreous hemorrhage; PRP panretinal laser photocoagulation; IVK intravitreal kenalog; VMT vitreomacular traction; MH Macular hole;  NVD neovascularization of the disc; NVE neovascularization elsewhere; AREDS age related eye disease study; ARMD age related macular degeneration; POAG primary open angle glaucoma; EBMD epithelial/anterior  basement membrane dystrophy; ACIOL anterior chamber intraocular lens; IOL intraocular lens; PCIOL posterior chamber intraocular lens; Phaco/IOL phacoemulsification with intraocular lens placement; [place] photorefractive keratectomy; LASIK laser assisted in situ keratomileusis; HTN hypertension; DM diabetes mellitus; COPD chronic obstructive pulmonary disease

## 2020-09-05 NOTE — Assessment & Plan Note (Signed)
Wet ARMD OD chronically active, will need to repeat intravitreal Eylea OD today to calm the intraretinal fluid currently at 8-week follow-up, repeat today and examination next in 7 weeks

## 2020-10-24 ENCOUNTER — Ambulatory Visit (INDEPENDENT_AMBULATORY_CARE_PROVIDER_SITE_OTHER): Payer: Medicare Other | Admitting: Ophthalmology

## 2020-10-24 ENCOUNTER — Other Ambulatory Visit: Payer: Self-pay

## 2020-10-24 ENCOUNTER — Encounter (INDEPENDENT_AMBULATORY_CARE_PROVIDER_SITE_OTHER): Payer: Self-pay | Admitting: Ophthalmology

## 2020-10-24 DIAGNOSIS — H43813 Vitreous degeneration, bilateral: Secondary | ICD-10-CM

## 2020-10-24 DIAGNOSIS — H353122 Nonexudative age-related macular degeneration, left eye, intermediate dry stage: Secondary | ICD-10-CM

## 2020-10-24 DIAGNOSIS — H353211 Exudative age-related macular degeneration, right eye, with active choroidal neovascularization: Secondary | ICD-10-CM | POA: Diagnosis not present

## 2020-10-24 MED ORDER — AFLIBERCEPT 2MG/0.05ML IZ SOLN FOR KALEIDOSCOPE
2.0000 mg | INTRAVITREAL | Status: AC | PRN
  Administered 2020-10-24: 2 mg via INTRAVITREAL

## 2020-10-24 NOTE — Progress Notes (Signed)
10/24/2020     CHIEF COMPLAINT Patient presents for No chief complaint on file.   HISTORY OF PRESENT ILLNESS: Jeanette Cross is a 87 y.o. female who presents to the clinic today for:     Referring physician: Monico Blitz, MD [place],  [place] [id number]  HISTORICAL INFORMATION:   Selected notes from the [facility]: No current outpatient medications on file. (Ophthalmic Drugs)   No current facility-administered medications for this visit. (Ophthalmic Drugs)   Current Outpatient Medications (Other)  Medication Sig   allopurinol (ZYLOPRIM) 100 MG tablet Take 100 mg by mouth daily.   levothyroxine (SYNTHROID) 50 MCG tablet Take 50 mcg by mouth daily.   lisinopril (ZESTRIL) 2.5 MG tablet Take 2.5 mg by mouth daily.   No current facility-administered medications for this visit. (Other)      REVIEW OF SYSTEMS:    ALLERGIES Allergies  Allergen Reactions   Novocain [Procaine]    Tylenol [Acetaminophen]     PAST MEDICAL HISTORY Past Medical History:  Diagnosis Date   Cancer ([place])    Hypertension    Macular degeneration    Past Surgical History:  Procedure Laterality Date   CATARACT EXTRACTION W/PHACO Bilateral     FAMILY HISTORY Family History  Problem Relation Age of Onset   Hypertension Sister     SOCIAL HISTORY Social History   Tobacco Use   Smoking status: Never   Smokeless tobacco: Never         OPHTHALMIC EXAM:  Base Eye Exam     Visual Acuity (ETDRS)       Right Left   Dist [place] 20/30 -1 20/40 -1         Tonometry (Tonopen, 2:22 PM)       Right Left   Pressure 13 16         Pupils       React APD   Right Brisk None   Left Brisk None         Visual Fields       Left Right    Full Full         Extraocular Movement       Right Left    Full, Ortho Full, Ortho         Neuro/Psych     Oriented x3: Yes   Mood/Affect: Normal         Dilation     Both  eyes: 1.0% Mydriacyl, 2.5% Phenylephrine @ 2:22 PM           Slit Lamp and Fundus Exam     External Exam       Right Left   External Normal Normal         Slit Lamp Exam       Right Left   Lids/Lashes Normal Normal   Conjunctiva/Sclera White and quiet White and quiet   Cornea Clear Clear   Anterior Chamber Deep and quiet Deep and quiet   Iris Round and reactive Round and reactive   Lens Posterior chamber intraocular lens Posterior chamber intraocular lens   Anterior Vitreous Normal Normal         Fundus Exam       Right Left   Posterior Vitreous Posterior vitreous detachment Posterior vitreous detachment   Disc Normal Normal   C/D Ratio 0.4 0.4   Macula Geographic atrophy , Hard drusen, Retinal pigment epithelial mottling, no macular thickening  Geographic atrophy , Hard drusen, Retinal pigment epithelial mottling, no macular thickening   Vessels Normal Normal   Periphery Normal Normal            IMAGING AND PROCEDURES  Imaging and Procedures for 10/24/20  OCT, Retina - OU - Both Eyes       Right Eye Quality was good. Scan locations included subfoveal. Central Foveal Thickness: 258. Progression has improved. Findings include abnormal foveal contour, cystoid macular edema, subretinal scarring, retinal drusen .   Left Eye Quality was good. Scan locations included subfoveal. Central Foveal Thickness: 288. Progression has been stable. Findings include retinal drusen , no SRF, no IRF.   Notes OD overall improved anatomy on an intravitreal Eylea compared to January 2022, and decreased intraretinal fluid and CME as compared to May 2022, repeat injection Eylea today at 7-week interval  OS no signs of CNVM      Intravitreal Injection, Pharmacologic Agent - OD - Right Eye       Time Out 10/24/2020. 2:36 PM. Confirmed correct patient, procedure, site, and patient consented.   Anesthesia Topical anesthesia was used. Anesthetic medications included [place]  3.5%.   Procedure Preparation included Ofloxacin , 10% betadine to eyelids, 5% betadine to ocular surface, Tobramycin 0.3%. A 30 gauge needle was used.   Injection: 2 mg aflibercept 2 MG/0.05ML   Route: Intravitreal   NDC: 84132-440-10, Lot: [id number], Waste: 0 mL   Post-op Post injection exam found visual acuity of at least counting fingers. The patient tolerated the procedure well. There were no complications. The patient received written and verbal post procedure care education. Post injection medications were not given.              ASSESSMENT/PLAN:  Exudative age-related macular degeneration of right eye with active choroidal neovascularization (HCC) At 7-week interval follow-up today, post Eylea injection, much less intraretinal fluid and CME temporal to the fovea and improved acuity and stabilization at 20/30.  Intermediate stage nonexudative age-related macular degeneration of left eye No signs of CNVM OS  Posterior vitreous detachment of both eyes  The nature of posterior vitreous detachment was discussed with the patient as well as its physiology, its age prevalence, and its possible implication regarding retinal breaks and detachment.  An informational brochure was offered to the patient.  All the patient's questions were answered.  The patient was asked to return if new or different flashes or floaters develops.   Patient was instructed to contact office immediately if any new changes were noticed. I explained to the patient that vitreous inside the eye is similar to jello inside a bowl. As the jello melts it can start to pull away from the bowl, similarly the vitreous throughout our lives can begin to pull away from the retina. That process is called a posterior vitreous detachment. In some cases, the vitreous can tug hard enough on the retina to form a retinal tear. I discussed with the patient the signs and symptoms of a retinal detachment.  Do not rub the eye.       ICD-10-CM   1. Exudative age-related macular degeneration of right eye with active choroidal neovascularization (HCC)  H35.3211 OCT, Retina - OU - Both Eyes    Intravitreal Injection, Pharmacologic Agent - OD - Right Eye    aflibercept (EYLEA) SOLN 2 mg    2. Intermediate stage nonexudative age-related macular degeneration of left eye  H35.3122 OCT, Retina - OU - Both Eyes    3. Posterior vitreous detachment  of both eyes  H43.813       1.  Repeat intravitreal Eylea OD today at 7-week follow-up interval and maintain 7-week follow-up interval to maintain acuity and resolution of CME from CNVM  2.  OS monitor and observe  3.  Ophthalmic Meds Ordered this visit:  Meds ordered this encounter  Medications   aflibercept (EYLEA) SOLN 2 mg       Return in about 6 weeks (around 12/05/2020) for dilate, OD, EYLEA OCT.  There are no Patient Instructions on file for this visit.   Explained the diagnoses, plan, and follow up with the patient and they expressed understanding.  Patient expressed understanding of the importance of proper follow up care.   Clent Demark Yareliz Thorstenson M.D. Diseases & Surgery of the Retina and Vitreous Retina & Diabetic [place] 10/24/20     Abbreviations: M myopia (nearsighted); A astigmatism; H hyperopia (farsighted); P presbyopia; Mrx spectacle prescription;  CTL contact lenses; OD right eye; OS left eye; OU both eyes  XT exotropia; ET esotropia; PEK punctate epithelial keratitis; PEE punctate epithelial erosions; DES dry eye syndrome; MGD meibomian gland dysfunction; ATs artificial tears; PFAT's preservative free artificial tears; [place] nuclear sclerotic cataract; PSC posterior subcapsular cataract; ERM epi-retinal membrane; PVD posterior vitreous detachment; RD retinal detachment; DM diabetes mellitus; DR diabetic retinopathy; NPDR non-proliferative diabetic retinopathy; PDR proliferative diabetic retinopathy; CSME clinically significant macular edema; DME diabetic macular  edema; dbh dot blot hemorrhages; CWS cotton wool spot; POAG primary open angle glaucoma; C/D cup-to-disc ratio; HVF humphrey visual field; GVF goldmann visual field; OCT optical coherence tomography; IOP intraocular pressure; BRVO Branch retinal vein occlusion; CRVO central retinal vein occlusion; CRAO central retinal artery occlusion; BRAO branch retinal artery occlusion; RT retinal tear; SB scleral buckle; PPV pars plana vitrectomy; VH Vitreous hemorrhage; PRP panretinal laser photocoagulation; IVK intravitreal kenalog; VMT vitreomacular traction; MH Macular hole;  NVD neovascularization of the disc; NVE neovascularization elsewhere; AREDS age related eye disease study; ARMD age related macular degeneration; POAG primary open angle glaucoma; EBMD epithelial/anterior basement membrane dystrophy; ACIOL anterior chamber intraocular lens; IOL intraocular lens; PCIOL posterior chamber intraocular lens; Phaco/IOL phacoemulsification with intraocular lens placement; [place] photorefractive keratectomy; LASIK laser assisted in situ keratomileusis; HTN hypertension; DM diabetes mellitus; COPD chronic obstructive pulmonary disease

## 2020-10-24 NOTE — Assessment & Plan Note (Signed)
No signs of CNVM OS

## 2020-10-24 NOTE — Assessment & Plan Note (Signed)
At 7-week interval follow-up today, post Eylea injection, much less intraretinal fluid and CME temporal to the fovea and improved acuity and stabilization at 20/30.

## 2020-10-24 NOTE — Assessment & Plan Note (Signed)

## 2020-12-05 ENCOUNTER — Other Ambulatory Visit: Payer: Self-pay

## 2020-12-05 ENCOUNTER — Encounter (INDEPENDENT_AMBULATORY_CARE_PROVIDER_SITE_OTHER): Payer: Self-pay | Admitting: Ophthalmology

## 2020-12-05 ENCOUNTER — Ambulatory Visit (INDEPENDENT_AMBULATORY_CARE_PROVIDER_SITE_OTHER): Payer: Medicare Other | Admitting: Ophthalmology

## 2020-12-05 DIAGNOSIS — H353211 Exudative age-related macular degeneration, right eye, with active choroidal neovascularization: Secondary | ICD-10-CM

## 2020-12-05 MED ORDER — AFLIBERCEPT 2MG/0.05ML IZ SOLN FOR KALEIDOSCOPE
2.0000 mg | INTRAVITREAL | Status: AC | PRN
  Administered 2020-12-05: 2 mg via INTRAVITREAL

## 2020-12-05 NOTE — Progress Notes (Signed)
12/05/2020     CHIEF COMPLAINT Patient presents for Retina Follow Up (6 WK FU OD OCT EYLEA OD/Pt denies noticeable changes to [place] OU since last visit. Pt denies ocular pain, flashes of light, or floaters OU. //)   HISTORY OF PRESENT ILLNESS: Jeanette Cross is a 87 y.o. female who presents to the clinic today for:   HPI     Retina Follow Up           Diagnosis: Wet AMD   Laterality: right eye   Onset: 6 weeks ago   Severity: mild   Duration: 6 weeks   Course: stable   Comments: 6 WK FU OD OCT EYLEA OD Pt denies noticeable changes to [place] OU since last visit. Pt denies ocular pain, flashes of light, or floaters OU.          Last edited by Laurin Coder, COA on 12/05/2020  1:28 PM.      Referring physician: Monico Blitz, MD [place],  [place] [address]  HISTORICAL INFORMATION:   Selected notes from the [facility]: No current outpatient medications on file. (Ophthalmic Drugs)   No current facility-administered medications for this visit. (Ophthalmic Drugs)   Current Outpatient Medications (Other)  Medication Sig   allopurinol (ZYLOPRIM) 100 MG tablet Take 100 mg by mouth daily.   levothyroxine (SYNTHROID) 50 MCG tablet Take 50 mcg by mouth daily.   lisinopril (ZESTRIL) 2.5 MG tablet Take 2.5 mg by mouth daily.   No current facility-administered medications for this visit. (Other)      REVIEW OF SYSTEMS:    ALLERGIES Allergies  Allergen Reactions   Novocain [Procaine]    Tylenol [Acetaminophen]     PAST MEDICAL HISTORY Past Medical History:  Diagnosis Date   Cancer ([place])    Hypertension    Macular degeneration    Past Surgical History:  Procedure Laterality Date   CATARACT EXTRACTION W/PHACO Bilateral     FAMILY HISTORY Family History  Problem Relation Age of Onset   Hypertension Sister     SOCIAL HISTORY Social History   Tobacco Use   Smoking status: Never   Smokeless tobacco: Never          OPHTHALMIC EXAM:  Base Eye Exam     Visual Acuity (ETDRS)       Right Left   Dist [place] 20/40 20/30   Dist ph [place] NI NI         Tonometry (Tonopen, 1:37 PM)       Right Left   Pressure 15 14         Pupils       Pupils Dark Light Shape React APD   Right PERRL 3 3 Round Minimal None   Left PERRL 4 4 Round Minimal None         Visual Fields (Counting fingers)       Left Right    Full Full         Extraocular Movement       Right Left    Full Full         Neuro/Psych     Oriented x3: Yes   Mood/Affect: Normal         Dilation     Right eye: 1.0% Mydriacyl, 2.5% Phenylephrine @ 1:37 PM           Slit Lamp and Fundus Exam     External Exam  Right Left   External Normal Normal         Slit Lamp Exam       Right Left   Lids/Lashes Normal Normal   Conjunctiva/Sclera White and quiet White and quiet   Cornea Clear Clear   Anterior Chamber Deep and quiet Deep and quiet   Iris Round and reactive Round and reactive   Lens Posterior chamber intraocular lens Posterior chamber intraocular lens   Anterior Vitreous Normal Normal         Fundus Exam       Right Left   Posterior Vitreous Posterior vitreous detachment    Disc Normal    C/D Ratio 0.4    Macula Geographic atrophy , Hard drusen, Retinal pigment epithelial mottling, no macular thickening    Vessels Normal    Periphery Normal             IMAGING AND PROCEDURES  Imaging and Procedures for 12/05/20  OCT, Retina - OU - Both Eyes       Right Eye Quality was good. Scan locations included subfoveal. Central Foveal Thickness: 240. Progression has improved. Findings include abnormal foveal contour, cystoid macular edema, subretinal scarring, retinal drusen .   Left Eye Quality was good. Scan locations included subfoveal. Central Foveal Thickness: 288. Progression has been stable. Findings include retinal drusen , no SRF, no IRF.   Notes OD overall improved  anatomy on an intravitreal Eylea compared to January 2022, and decreased intraretinal fluid and CME as compared to May 2022, repeat injection Eylea today at 6-week interval  OS no signs of CNVM      Intravitreal Injection, Pharmacologic Agent - OD - Right Eye       Time Out 12/05/2020. 3:07 PM. Confirmed correct patient, procedure, site, and patient consented.   Procedure Preparation included 5% betadine to ocular surface. A supplied needle was used.   Injection: 2 mg aflibercept 2 MG/0.05ML   Route: Intravitreal, Site: Right Eye   NDC: [id number], Lot: [id number], Waste: 0 mL   Post-op Post injection exam found visual acuity of at least counting fingers. The patient tolerated the procedure well. There were no complications. The patient received written and verbal post procedure care education. Post injection medications were not given.              ASSESSMENT/PLAN:  Exudative age-related macular degeneration of right eye with active choroidal neovascularization (HCC) Much improved macular anatomy at  6 weeks post injection Eylea OD.  We will repeat injection today and extend interval of treatment now to 8 weeks     ICD-10-CM   1. Exudative age-related macular degeneration of right eye with active choroidal neovascularization (HCC)  H35.3211 OCT, Retina - OU - Both Eyes    Intravitreal Injection, Pharmacologic Agent - OD - Right Eye    aflibercept (EYLEA) SOLN 2 mg      1.  Vastly improved and less active CNVM OD currently at 6 weeks post Eylea OD, will repeat injection today and evaluation next in 8 weeks 2.  3.  Ophthalmic Meds Ordered this visit:  Meds ordered this encounter  Medications   aflibercept (EYLEA) SOLN 2 mg       Return in about 8 weeks (around 01/30/2021) for dilate, OD, EYLEA OCT.  There are no Patient Instructions on file for this visit.   Explained the diagnoses, plan, and follow up with the patient and they expressed understanding.   Patient expressed understanding of the importance of proper follow  up care.   Clent Demark Cherita Hebel M.D. Diseases & Surgery of the Retina and Vitreous Retina & Diabetic [place] 12/05/20     Abbreviations: M myopia (nearsighted); A astigmatism; H hyperopia (farsighted); P presbyopia; Mrx spectacle prescription;  CTL contact lenses; OD right eye; OS left eye; OU both eyes  XT exotropia; ET esotropia; PEK punctate epithelial keratitis; PEE punctate epithelial erosions; DES dry eye syndrome; MGD meibomian gland dysfunction; ATs artificial tears; PFAT's preservative free artificial tears; [place] nuclear sclerotic cataract; PSC posterior subcapsular cataract; ERM epi-retinal membrane; PVD posterior vitreous detachment; RD retinal detachment; DM diabetes mellitus; DR diabetic retinopathy; NPDR non-proliferative diabetic retinopathy; PDR proliferative diabetic retinopathy; CSME clinically significant macular edema; DME diabetic macular edema; dbh dot blot hemorrhages; CWS cotton wool spot; POAG primary open angle glaucoma; C/D cup-to-disc ratio; HVF humphrey visual field; GVF goldmann visual field; OCT optical coherence tomography; IOP intraocular pressure; BRVO Branch retinal vein occlusion; CRVO central retinal vein occlusion; CRAO central retinal artery occlusion; BRAO branch retinal artery occlusion; RT retinal tear; SB scleral buckle; PPV pars plana vitrectomy; VH Vitreous hemorrhage; PRP panretinal laser photocoagulation; IVK intravitreal kenalog; VMT vitreomacular traction; MH Macular hole;  NVD neovascularization of the disc; NVE neovascularization elsewhere; AREDS age related eye disease study; ARMD age related macular degeneration; POAG primary open angle glaucoma; EBMD epithelial/anterior basement membrane dystrophy; ACIOL anterior chamber intraocular lens; IOL intraocular lens; PCIOL posterior chamber intraocular lens; Phaco/IOL phacoemulsification with intraocular lens placement; [place] photorefractive  keratectomy; LASIK laser assisted in situ keratomileusis; HTN hypertension; DM diabetes mellitus; COPD chronic obstructive pulmonary disease

## 2020-12-05 NOTE — Assessment & Plan Note (Signed)
Much improved macular anatomy at  6 weeks post injection Eylea OD.  We will repeat injection today and extend interval of treatment now to 8 weeks

## 2021-01-30 ENCOUNTER — Other Ambulatory Visit: Payer: Self-pay

## 2021-01-30 ENCOUNTER — Encounter (INDEPENDENT_AMBULATORY_CARE_PROVIDER_SITE_OTHER): Payer: Self-pay | Admitting: Ophthalmology

## 2021-01-30 ENCOUNTER — Ambulatory Visit (INDEPENDENT_AMBULATORY_CARE_PROVIDER_SITE_OTHER): Payer: Medicare Other | Admitting: Ophthalmology

## 2021-01-30 DIAGNOSIS — H353114 Nonexudative age-related macular degeneration, right eye, advanced atrophic with subfoveal involvement: Secondary | ICD-10-CM | POA: Diagnosis not present

## 2021-01-30 DIAGNOSIS — H353211 Exudative age-related macular degeneration, right eye, with active choroidal neovascularization: Secondary | ICD-10-CM

## 2021-01-30 MED ORDER — AFLIBERCEPT 2MG/0.05ML IZ SOLN FOR KALEIDOSCOPE
2.0000 mg | INTRAVITREAL | Status: AC | PRN
  Administered 2021-01-30: 2 mg via INTRAVITREAL

## 2021-01-30 NOTE — Assessment & Plan Note (Signed)
New onset accounting for acuity

## 2021-01-30 NOTE — Progress Notes (Signed)
01/30/2021     CHIEF COMPLAINT Patient presents for  Chief Complaint  Patient presents with   Retina Follow Up    6 WK FU OD OCT EYLEA OD Pt denies noticeable changes to [place] OU since last visit. Pt denies ocular pain, flashes of light, or floaters OU.         HISTORY OF PRESENT ILLNESS: Jeanette Cross is a 87 y.o. female who presents to the clinic today for:   HPI     Retina Follow Up   Patient presents with  Wet AMD.  In right eye.  This started 8 weeks ago.  Severity is mild.  Duration of 8 weeks.  Since onset it is stable. Additional comments: 6 WK FU OD OCT EYLEA OD Pt denies noticeable changes to [place] OU since last visit. Pt denies ocular pain, flashes of light, or floaters OU.           Comments   8 week fu OD oct Eylea OD. Pt states "my right eye seems better than before. My left eye is about the same."  Pt denies any new floaters or FOL.       Last edited by Laurin Coder on 01/30/2021  1:50 PM.      Referring physician: Monico Blitz, MD [place],  [place] [address]  HISTORICAL INFORMATION:   Selected notes from the [facility]: No current outpatient medications on file. (Ophthalmic Drugs)   No current facility-administered medications for this visit. (Ophthalmic Drugs)   Current Outpatient Medications (Other)  Medication Sig   allopurinol (ZYLOPRIM) 100 MG tablet Take 100 mg by mouth daily.   levothyroxine (SYNTHROID) 50 MCG tablet Take 50 mcg by mouth daily.   lisinopril (ZESTRIL) 2.5 MG tablet Take 2.5 mg by mouth daily.   No current facility-administered medications for this visit. (Other)      REVIEW OF SYSTEMS:    ALLERGIES Allergies  Allergen Reactions   Novocain [Procaine]    Tylenol [Acetaminophen]     PAST MEDICAL HISTORY Past Medical History:  Diagnosis Date   Cancer ([place])    Hypertension    Macular degeneration    Past Surgical History:  Procedure Laterality Date    CATARACT EXTRACTION W/PHACO Bilateral     FAMILY HISTORY Family History  Problem Relation Age of Onset   Hypertension Sister     SOCIAL HISTORY Social History   Tobacco Use   Smoking status: Never   Smokeless tobacco: Never         OPHTHALMIC EXAM:  Base Eye Exam     Visual Acuity (ETDRS)       Right Left   Dist [place] 20/30 20/40 -2+2   Dist ph [place] NI NI         Tonometry (Tonopen, 1:55 PM)       Right Left   Pressure 12 17         Pupils       Dark Light Shape React APD   Right 2.5 2.5 Round Minimal None   Left 3 3 Round Minimal None         Extraocular Movement       Right Left    Full Full         Neuro/Psych     Oriented x3: Yes   Mood/Affect: Normal         Dilation     Both eyes: 1.0% Mydriacyl,  2.5% Phenylephrine @ 1:55 PM           Slit Lamp and Fundus Exam     External Exam       Right Left   External Normal Normal         Slit Lamp Exam       Right Left   Lids/Lashes Normal Normal   Conjunctiva/Sclera White and quiet White and quiet   Cornea Clear Clear   Anterior Chamber Deep and quiet Deep and quiet   Iris Round and reactive Round and reactive   Lens Posterior chamber intraocular lens Posterior chamber intraocular lens   Anterior Vitreous Normal Normal         Fundus Exam       Right Left   Posterior Vitreous Posterior vitreous detachment    Disc Normal    C/D Ratio 0.4    Macula Geographic atrophy , Hard drusen, Retinal pigment epithelial mottling, no macular thickening    Vessels Normal    Periphery Normal             IMAGING AND PROCEDURES  Imaging and Procedures for 01/30/21  OCT, Retina - OU - Both Eyes       Right Eye Quality was good. Scan locations included subfoveal. Central Foveal Thickness: 252. Progression has improved. Findings include abnormal foveal contour, cystoid macular edema, subretinal scarring, retinal drusen .   Left Eye Quality was good. Scan locations included  subfoveal. Central Foveal Thickness: 285. Progression has been stable. Findings include retinal drusen , no SRF, no IRF.   Notes OD overall improved anatomy on an intravitreal Eylea compared to January 2022, and decreased intraretinal fluid and CME as compared to May 2022, repeat injection Eylea today at 8-week interval  OS no signs of CNVM      Intravitreal Injection, Pharmacologic Agent - OD - Right Eye       Time Out 01/30/2021. 2:09 PM. Confirmed correct patient, procedure, site, and patient consented.   Anesthesia Anesthetic medications included [place] 3.5%.   Procedure Preparation included 5% betadine to ocular surface. A supplied needle was used.   Injection: 2 mg aflibercept 2 MG/0.05ML   Route: Intravitreal, Site: Right Eye   NDC: [phone], Waste: 0 mL   Post-op Post injection exam found visual acuity of at least counting fingers. The patient tolerated the procedure well. There were no complications. The patient received written and verbal post procedure care education. Post injection medications included ocuflox.              ASSESSMENT/PLAN:  Exudative age-related macular degeneration of right eye with active choroidal neovascularization [facility]) Onset April 2021 and ongoing perifoveal CME for many months thereafter now at resolution of CME, atrophy remains accounting for some impact on acuity.  Nonetheless patient with extension interval 8 weeks is doing well.  Repeat Eylea today.  Will extend interval examination next 10 weeks  Advanced nonexudative age-related macular degeneration of right eye with subfoveal involvement New onset accounting for acuity     ICD-10-CM   1. Exudative age-related macular degeneration of right eye with active choroidal neovascularization (HCC)  H35.3211 OCT, Retina - OU - Both Eyes    Intravitreal Injection, Pharmacologic Agent - OD - Right Eye    aflibercept (EYLEA) SOLN 2 mg    2. Advanced nonexudative age-related macular  degeneration of right eye with subfoveal involvement  H35.3114       1.  OD vastly improved macular findings by OCT currently at 8-week  follow-up.  No recurrence of perifoveal CME noted previously.  Clinically this is in an area of now geographic atrophy suggestion at this may no longer have active CNVM capability.  Nonetheless repeat injection today and follow-up next in 10 weeks  2.  3.  Ophthalmic Meds Ordered this visit:  Meds ordered this encounter  Medications   aflibercept (EYLEA) SOLN 2 mg       Return in about 10 weeks (around 04/10/2021) for dilate, OD, EYLEA OCT.  There are no Patient Instructions on file for this visit.   Explained the diagnoses, plan, and follow up with the patient and they expressed understanding.  Patient expressed understanding of the importance of proper follow up care.   Clent Demark Lautaro Koral M.D. Diseases & Surgery of the Retina and Vitreous Retina & Diabetic [place] 01/30/21     Abbreviations: M myopia (nearsighted); A astigmatism; H hyperopia (farsighted); P presbyopia; Mrx spectacle prescription;  CTL contact lenses; OD right eye; OS left eye; OU both eyes  XT exotropia; ET esotropia; PEK punctate epithelial keratitis; PEE punctate epithelial erosions; DES dry eye syndrome; MGD meibomian gland dysfunction; ATs artificial tears; PFAT's preservative free artificial tears; [place] nuclear sclerotic cataract; PSC posterior subcapsular cataract; ERM epi-retinal membrane; PVD posterior vitreous detachment; RD retinal detachment; DM diabetes mellitus; DR diabetic retinopathy; NPDR non-proliferative diabetic retinopathy; PDR proliferative diabetic retinopathy; CSME clinically significant macular edema; DME diabetic macular edema; dbh dot blot hemorrhages; CWS cotton wool spot; POAG primary open angle glaucoma; C/D cup-to-disc ratio; HVF humphrey visual field; GVF goldmann visual field; OCT optical coherence tomography; IOP intraocular pressure; BRVO Branch  retinal vein occlusion; CRVO central retinal vein occlusion; CRAO central retinal artery occlusion; BRAO branch retinal artery occlusion; RT retinal tear; SB scleral buckle; PPV pars plana vitrectomy; VH Vitreous hemorrhage; PRP panretinal laser photocoagulation; IVK intravitreal kenalog; VMT vitreomacular traction; MH Macular hole;  NVD neovascularization of the disc; NVE neovascularization elsewhere; AREDS age related eye disease study; ARMD age related macular degeneration; POAG primary open angle glaucoma; EBMD epithelial/anterior basement membrane dystrophy; ACIOL anterior chamber intraocular lens; IOL intraocular lens; PCIOL posterior chamber intraocular lens; Phaco/IOL phacoemulsification with intraocular lens placement; [place] photorefractive keratectomy; LASIK laser assisted in situ keratomileusis; HTN hypertension; DM diabetes mellitus; COPD chronic obstructive pulmonary disease

## 2021-01-30 NOTE — Assessment & Plan Note (Signed)
Onset April 2021 and ongoing perifoveal CME for many months thereafter now at resolution of CME, atrophy remains accounting for some impact on acuity.  Nonetheless patient with extension interval 8 weeks is doing well.  Repeat Eylea today.  Will extend interval examination next 10 weeks

## 2021-02-21 DIAGNOSIS — R5383 Other fatigue: Secondary | ICD-10-CM | POA: Diagnosis not present

## 2021-02-21 DIAGNOSIS — Z23 Encounter for immunization: Secondary | ICD-10-CM | POA: Diagnosis not present

## 2021-02-21 DIAGNOSIS — I10 Essential (primary) hypertension: Secondary | ICD-10-CM | POA: Diagnosis not present

## 2021-02-21 DIAGNOSIS — Z0000 Encounter for general adult medical examination without abnormal findings: Secondary | ICD-10-CM | POA: Diagnosis not present

## 2021-02-21 DIAGNOSIS — E039 Hypothyroidism, unspecified: Secondary | ICD-10-CM | POA: Diagnosis not present

## 2021-02-21 DIAGNOSIS — Z1331 Encounter for screening for depression: Secondary | ICD-10-CM | POA: Diagnosis not present

## 2021-02-21 DIAGNOSIS — E559 Vitamin D deficiency, unspecified: Secondary | ICD-10-CM | POA: Diagnosis not present

## 2021-02-21 DIAGNOSIS — Z7189 Other specified counseling: Secondary | ICD-10-CM | POA: Diagnosis not present

## 2021-02-21 DIAGNOSIS — Z79899 Other long term (current) drug therapy: Secondary | ICD-10-CM | POA: Diagnosis not present

## 2021-02-21 DIAGNOSIS — Z1339 Encounter for screening examination for other mental health and behavioral disorders: Secondary | ICD-10-CM | POA: Diagnosis not present

## 2021-02-21 DIAGNOSIS — G4710 Hypersomnia, unspecified: Secondary | ICD-10-CM | POA: Diagnosis not present

## 2021-02-21 DIAGNOSIS — Z6823 Body mass index (BMI) 23.0-23.9, adult: Secondary | ICD-10-CM | POA: Diagnosis not present

## 2021-02-21 DIAGNOSIS — Z299 Encounter for prophylactic measures, unspecified: Secondary | ICD-10-CM | POA: Diagnosis not present

## 2021-02-21 DIAGNOSIS — E7800 Pure hypercholesterolemia, unspecified: Secondary | ICD-10-CM | POA: Diagnosis not present

## 2021-02-25 DIAGNOSIS — I10 Essential (primary) hypertension: Secondary | ICD-10-CM | POA: Diagnosis not present

## 2021-02-25 DIAGNOSIS — H6121 Impacted cerumen, right ear: Secondary | ICD-10-CM | POA: Diagnosis not present

## 2021-04-10 ENCOUNTER — Encounter (INDEPENDENT_AMBULATORY_CARE_PROVIDER_SITE_OTHER): Payer: Self-pay | Admitting: Ophthalmology

## 2021-04-10 ENCOUNTER — Ambulatory Visit (INDEPENDENT_AMBULATORY_CARE_PROVIDER_SITE_OTHER): Payer: Medicare Other | Admitting: Ophthalmology

## 2021-04-10 ENCOUNTER — Other Ambulatory Visit: Payer: Self-pay

## 2021-04-10 DIAGNOSIS — H353211 Exudative age-related macular degeneration, right eye, with active choroidal neovascularization: Secondary | ICD-10-CM | POA: Diagnosis not present

## 2021-04-10 MED ORDER — AFLIBERCEPT 2MG/0.05ML IZ SOLN FOR KALEIDOSCOPE
2.0000 mg | INTRAVITREAL | Status: AC | PRN
Start: 2021-04-10 — End: 2021-04-10
  Administered 2021-04-10: 2 mg via INTRAVITREAL

## 2021-04-10 NOTE — Assessment & Plan Note (Signed)
Increased perifoveal CME today and intraretinal fluid at 10-week follow-up interval.  We will repeat injection Eylea today and maintain follow-up examination next interval at 8 weeks

## 2021-04-10 NOTE — Progress Notes (Signed)
04/10/2021     CHIEF COMPLAINT Patient presents for  Chief Complaint  Patient presents with   Retina Follow Up    6 WK FU OD OCT EYLEA OD Pt denies noticeable changes to [place] OU since last visit. Pt denies ocular pain, flashes of light, or floaters OU.         HISTORY OF PRESENT ILLNESS: Jeanette Cross is a 87 y.o. female who presents to the clinic today for:   HPI     Retina Follow Up           Diagnosis: Wet AMD   Laterality: right eye   Onset: 8 weeks ago   Severity: mild   Duration: 8 weeks   Course: gradually improving   Comments: 6 WK FU OD OCT EYLEA OD Pt denies noticeable changes to [place] OU since last visit. Pt denies ocular pain, flashes of light, or floaters OU.            Comments   10 week fu OD oct Eylea OD. Pt states vision is a little better, "it seems like I can keep my eye open better than I used to also." Denies new FOL or floater.      Last edited by Laurin Coder on 04/10/2021  1:12 PM.      Referring physician: Monico Blitz, MD [place],  [address] [address]  HISTORICAL INFORMATION:   Selected notes from the [facility]: No current outpatient medications on file. (Ophthalmic Drugs)   No current facility-administered medications for this visit. (Ophthalmic Drugs)   Current Outpatient Medications (Other)  Medication Sig   allopurinol (ZYLOPRIM) 100 MG tablet Take 100 mg by mouth daily.   levothyroxine (SYNTHROID) 50 MCG tablet Take 50 mcg by mouth daily.   lisinopril (ZESTRIL) 2.5 MG tablet Take 2.5 mg by mouth daily.   No current facility-administered medications for this visit. (Other)      REVIEW OF SYSTEMS:    ALLERGIES Allergies  Allergen Reactions   Novocain [Procaine]    Tylenol [Acetaminophen]     PAST MEDICAL HISTORY Past Medical History:  Diagnosis Date   Cancer ([place])    Hypertension    Macular degeneration    Past Surgical History:  Procedure Laterality  Date   CATARACT EXTRACTION W/PHACO Bilateral     FAMILY HISTORY Family History  Problem Relation Age of Onset   Hypertension Sister     SOCIAL HISTORY Social History   Tobacco Use   Smoking status: Never   Smokeless tobacco: Never         OPHTHALMIC EXAM:  Base Eye Exam     Visual Acuity (ETDRS)       Right Left   Dist [place] 20/40 -1 20/30 -2   Dist ph  NI          Tonometry (Tonopen, 1:14 PM)       Right Left   Pressure 17 14         Pupils       Pupils Dark Light React APD   Right PERRL 2.5 2.5 Minimal None   Left PERRL 3 3 Minimal None         Extraocular Movement       Right Left    Full Full         Neuro/Psych     Oriented x3: Yes   Mood/Affect: Normal  Dilation     Right eye: 1.0% Mydriacyl, 2.5% Phenylephrine @ 1:14 PM           Slit Lamp and Fundus Exam     External Exam       Right Left   External Normal Normal         Slit Lamp Exam       Right Left   Lids/Lashes Normal Normal   Conjunctiva/Sclera White and quiet White and quiet   Cornea Clear Clear   Anterior Chamber Deep and quiet Deep and quiet   Iris Round and reactive Round and reactive   Lens Posterior chamber intraocular lens Posterior chamber intraocular lens   Anterior Vitreous Normal Normal         Fundus Exam       Right Left   Posterior Vitreous Posterior vitreous detachment    Disc Normal    C/D Ratio 0.4    Macula Geographic atrophy, Hard drusen, Retinal pigment epithelial mottling, no macular thickening    Vessels Normal    Periphery Normal             IMAGING AND PROCEDURES  Imaging and Procedures for 04/10/21  Intravitreal Injection, Pharmacologic Agent - OD - Right Eye       Time Out 04/10/2021. 2:15 PM. Confirmed correct patient, procedure, site, and patient consented.   Anesthesia Topical anesthesia was used. Anesthetic medications included Lidocaine 4%.   Procedure Preparation included 5% betadine to  ocular surface, Tobramycin 0.3%, Ofloxacin . A 30 gauge needle was used.   Injection: 2 mg aflibercept 2 MG/0.05ML   Route: Intravitreal, Site: Right Eye   NDC: [id number], Lot: [id number], Waste: 0 mL   Post-op Post injection exam found visual acuity of at least counting fingers. The patient tolerated the procedure well. There were no complications. The patient received written and verbal post procedure care education. Post injection medications included ocuflox.      OCT, Retina - OU - Both Eyes       Right Eye Quality was good. Scan locations included subfoveal. Central Foveal Thickness: 289. Progression has improved. Findings include abnormal foveal contour, cystoid macular edema, subretinal scarring, retinal drusen .   Left Eye Quality was good. Scan locations included subfoveal. Central Foveal Thickness: 282. Progression has been stable. Findings include retinal drusen , no SRF, no IRF.   Notes OD overall improved anatomy on an intravitreal Eylea compared to January 2022, and decreased intraretinal fluid and CME as compared to May 2022,  at 10 week interval, withe increased CME and intraretinal fluid.  Repeat intravitreal Eylea today and shorten interval examination next 8 weeks OS no signs of CNVM              ASSESSMENT/PLAN:  Exudative age-related macular degeneration of right eye with active choroidal neovascularization (HCC) Increased perifoveal CME today and intraretinal fluid at 10-week follow-up interval.  We will repeat injection Eylea today and maintain follow-up examination next interval at 8 weeks     ICD-10-CM   1. Exudative age-related macular degeneration of right eye with active choroidal neovascularization (HCC)  H35.3211 Intravitreal Injection, Pharmacologic Agent - OD - Right Eye    OCT, Retina - OU - Both Eyes    aflibercept (EYLEA) SOLN 2 mg      1.  Chronic active and recurrent CNVM OD, attempt today to extend interval examination to 10 weeks  unsuccessful with recurrence of fluid and CME.  We will repeat injection Eylea now and examination  again in 8 weeks  2.  Dilate OU next  3.  Ophthalmic Meds Ordered this visit:  Meds ordered this encounter  Medications   aflibercept (EYLEA) SOLN 2 mg       Return in about 8 weeks (around 06/05/2021) for DILATE OU, EYLEA OCT, OD.  There are no Patient Instructions on file for this visit.   Explained the diagnoses, plan, and follow up with the patient and they expressed understanding.  Patient expressed understanding of the importance of proper follow up care.   Clent Demark Arnold Depinto M.D. Diseases & Surgery of the Retina and Vitreous Retina & Diabetic [place] 04/10/21     Abbreviations: M myopia (nearsighted); A astigmatism; H hyperopia (farsighted); P presbyopia; Mrx spectacle prescription;  CTL contact lenses; OD right eye; OS left eye; OU both eyes  XT exotropia; ET esotropia; PEK punctate epithelial keratitis; PEE punctate epithelial erosions; DES dry eye syndrome; MGD meibomian gland dysfunction; ATs artificial tears; PFAT's preservative free artificial tears; [place] nuclear sclerotic cataract; PSC posterior subcapsular cataract; ERM epi-retinal membrane; PVD posterior vitreous detachment; RD retinal detachment; DM diabetes mellitus; DR diabetic retinopathy; NPDR non-proliferative diabetic retinopathy; PDR proliferative diabetic retinopathy; CSME clinically significant macular edema; DME diabetic macular edema; dbh dot blot hemorrhages; CWS cotton wool spot; POAG primary open angle glaucoma; C/D cup-to-disc ratio; HVF humphrey visual field; GVF goldmann visual field; OCT optical coherence tomography; IOP intraocular pressure; BRVO Branch retinal vein occlusion; CRVO central retinal vein occlusion; CRAO central retinal artery occlusion; BRAO branch retinal artery occlusion; RT retinal tear; SB scleral buckle; PPV pars plana vitrectomy; VH Vitreous hemorrhage; PRP panretinal laser  photocoagulation; IVK intravitreal kenalog; VMT vitreomacular traction; MH Macular hole;  NVD neovascularization of the disc; NVE neovascularization elsewhere; AREDS age related eye disease study; ARMD age related macular degeneration; POAG primary open angle glaucoma; EBMD epithelial/anterior basement membrane dystrophy; ACIOL anterior chamber intraocular lens; IOL intraocular lens; PCIOL posterior chamber intraocular lens; Phaco/IOL phacoemulsification with intraocular lens placement; [place] photorefractive keratectomy; LASIK laser assisted in situ keratomileusis; HTN hypertension; DM diabetes mellitus; COPD chronic obstructive pulmonary disease

## 2021-04-21 DIAGNOSIS — R5383 Other fatigue: Secondary | ICD-10-CM | POA: Diagnosis not present

## 2021-04-21 DIAGNOSIS — Z299 Encounter for prophylactic measures, unspecified: Secondary | ICD-10-CM | POA: Diagnosis not present

## 2021-04-21 DIAGNOSIS — G4710 Hypersomnia, unspecified: Secondary | ICD-10-CM | POA: Diagnosis not present

## 2021-04-21 DIAGNOSIS — Z6822 Body mass index (BMI) 22.0-22.9, adult: Secondary | ICD-10-CM | POA: Diagnosis not present

## 2021-04-21 DIAGNOSIS — I10 Essential (primary) hypertension: Secondary | ICD-10-CM | POA: Diagnosis not present

## 2021-06-05 ENCOUNTER — Other Ambulatory Visit: Payer: Self-pay

## 2021-06-05 ENCOUNTER — Encounter (INDEPENDENT_AMBULATORY_CARE_PROVIDER_SITE_OTHER): Payer: Self-pay | Admitting: Ophthalmology

## 2021-06-05 ENCOUNTER — Ambulatory Visit (INDEPENDENT_AMBULATORY_CARE_PROVIDER_SITE_OTHER): Payer: Medicare Other | Admitting: Ophthalmology

## 2021-06-05 DIAGNOSIS — H353114 Nonexudative age-related macular degeneration, right eye, advanced atrophic with subfoveal involvement: Secondary | ICD-10-CM

## 2021-06-05 DIAGNOSIS — H353122 Nonexudative age-related macular degeneration, left eye, intermediate dry stage: Secondary | ICD-10-CM

## 2021-06-05 DIAGNOSIS — H353211 Exudative age-related macular degeneration, right eye, with active choroidal neovascularization: Secondary | ICD-10-CM

## 2021-06-05 DIAGNOSIS — H43813 Vitreous degeneration, bilateral: Secondary | ICD-10-CM

## 2021-06-05 MED ORDER — AFLIBERCEPT 2MG/0.05ML IZ SOLN FOR KALEIDOSCOPE
2.0000 mg | INTRAVITREAL | Status: AC | PRN
  Administered 2021-06-05: 2 mg via INTRAVITREAL

## 2021-06-05 NOTE — Assessment & Plan Note (Signed)
OS no sign of CNVM 

## 2021-06-05 NOTE — Assessment & Plan Note (Signed)
Atrophy developing parafoveal

## 2021-06-05 NOTE — Assessment & Plan Note (Signed)
OD much less intraretinal fluid and subretinal fluid as compared to December 2022.  Currently at 8-week interval.  Repeat injection today and maintain 8-week interval.

## 2021-06-05 NOTE — Progress Notes (Signed)
06/05/2021     CHIEF COMPLAINT Patient presents for  Chief Complaint  Patient presents with   Retina Follow Up      HISTORY OF PRESENT ILLNESS: Jeanette Cross is a 88 y.o. female who presents to the clinic today for:   HPI     Retina Follow Up           Diagnosis: Wet AMD   Laterality: right eye   Onset: 8 weeks ago   Severity: mild   Duration: 8 weeks   Course: stable         Comments   8 week fu OU oct Eylea OD. Pt states "sometimes it seems better than other times." Denies new FOL or floaters.      Last edited by Laurin Coder on 06/05/2021  1:49 PM.      Referring physician: Monico Blitz, MD [place],  [place] [address]  HISTORICAL INFORMATION:   Selected notes from the [facility]: No current outpatient medications on file. (Ophthalmic Drugs)   No current facility-administered medications for this visit. (Ophthalmic Drugs)   Current Outpatient Medications (Other)  Medication Sig   allopurinol (ZYLOPRIM) 100 MG tablet Take 100 mg by mouth daily.   levothyroxine (SYNTHROID) 50 MCG tablet Take 50 mcg by mouth daily.   lisinopril (ZESTRIL) 2.5 MG tablet Take 2.5 mg by mouth daily.   No current facility-administered medications for this visit. (Other)      REVIEW OF SYSTEMS: ROS   Negative for: Constitutional, Gastrointestinal, Neurological, Skin, Genitourinary, Musculoskeletal, HENT, Endocrine, Cardiovascular, Eyes, Respiratory, Psychiatric, Allergic/Imm, Heme/Lymph Last edited by Hurman Horn, MD on 06/05/2021  2:20 PM.       ALLERGIES Allergies  Allergen Reactions   Novocain [Procaine]    Tylenol [Acetaminophen]     PAST MEDICAL HISTORY Past Medical History:  Diagnosis Date   Cancer ([place])    Hypertension    Macular degeneration    Past Surgical History:  Procedure Laterality Date   CATARACT EXTRACTION W/PHACO Bilateral     FAMILY HISTORY Family History  Problem Relation Age of  Onset   Hypertension Sister     SOCIAL HISTORY Social History   Tobacco Use   Smoking status: Never   Smokeless tobacco: Never         OPHTHALMIC EXAM:  Base Eye Exam     Visual Acuity (ETDRS)       Right Left   Dist [place] 20/40 -2 20/25 -2   Dist ph [place] 20/30          Tonometry (Tonopen, 1:52 PM)       Right Left   Pressure 11 14         Pupils       Pupils Dark Light React APD   Right PERRL 2 2 Minimal None   Left PERRL 3 3 Minimal None         Extraocular Movement       Right Left    Full Full         Neuro/Psych     Oriented x3: Yes   Mood/Affect: Normal         Dilation     Both eyes: 1.0% Mydriacyl, 2.5% Phenylephrine @ 1:52 PM           Slit Lamp and Fundus Exam     External Exam       Right Left  External Normal Normal         Slit Lamp Exam       Right Left   Lids/Lashes Normal Normal   Conjunctiva/Sclera White and quiet White and quiet   Cornea Clear Clear   Anterior Chamber Deep and quiet Deep and quiet   Iris Round and reactive Round and reactive   Lens Posterior chamber intraocular lens Posterior chamber intraocular lens   Anterior Vitreous Normal Normal         Fundus Exam       Right Left   Posterior Vitreous Posterior vitreous detachment Posterior vitreous detachment   Disc Normal Normal   C/D Ratio 0.4 0.4   Macula Geographic atrophy, Hard drusen, Retinal pigment epithelial mottling, no macular thickening Geographic atrophy , Hard drusen, Retinal pigment epithelial mottling, no macular thickening   Vessels Normal Normal   Periphery Normal Normal            IMAGING AND PROCEDURES  Imaging and Procedures for 06/05/21  Intravitreal Injection, Pharmacologic Agent - OD - Right Eye       Time Out 06/05/2021. 2:21 PM. Confirmed correct patient, procedure, site, and patient consented.   Anesthesia Topical anesthesia was used. Anesthetic medications included Lidocaine 4%.    Procedure Preparation included 5% betadine to ocular surface, Tobramycin 0.3%, Ofloxacin . A 30 gauge needle was used.   Injection: 2 mg aflibercept 2 MG/0.05ML   Route: Intravitreal, Site: Right Eye   NDC: [id number], Lot: [id number], Waste: 0 mL   Post-op Post injection exam found visual acuity of at least counting fingers. The patient tolerated the procedure well. There were no complications. The patient received written and verbal post procedure care education. Post injection medications included ocuflox.      OCT, Retina - OU - Both Eyes       Right Eye Quality was good. Scan locations included subfoveal. Central Foveal Thickness: 258. Progression has improved. Findings include abnormal foveal contour, subretinal scarring, retinal drusen , no SRF, no IRF.   Left Eye Quality was good. Scan locations included subfoveal. Central Foveal Thickness: 282. Progression has been stable. Findings include retinal drusen , no SRF, no IRF.   Notes OD overall improved anatomy on an intravitreal Eylea compared to January 2022, and decreased intraretinal fluid and CME as compared to May 2022, and much less subretinal fluid and intraretinal fluid as compared to last visit December 2022.             ASSESSMENT/PLAN:  Exudative age-related macular degeneration of right eye with active choroidal neovascularization ([place]) OD much less intraretinal fluid and subretinal fluid as compared to December 2022.  Currently at 8-week interval.  Repeat injection today and maintain 8-week interval.  Intermediate stage nonexudative age-related macular degeneration of left eye OS no sign of CNVM  Posterior vitreous detachment of both eyes Physiologic OU  Advanced nonexudative age-related macular degeneration of right eye with subfoveal involvement Atrophy developing parafoveal     ICD-10-CM   1. Exudative age-related macular degeneration of right eye with active choroidal neovascularization  (HCC)  H35.3211 Intravitreal Injection, Pharmacologic Agent - OD - Right Eye    OCT, Retina - OU - Both Eyes    aflibercept (EYLEA) SOLN 2 mg    2. Intermediate stage nonexudative age-related macular degeneration of left eye  H35.3122     3. Posterior vitreous detachment of both eyes  H43.813     4. Advanced nonexudative age-related macular degeneration of right eye with subfoveal involvement  H35.3114       1.  OD much improved macular findings, acuity and OCT findings today at 8-week post Eylea.  Repeat injection today reevaluate again in 8 weeks dilate OD  2.  3.  Ophthalmic Meds Ordered this visit:  Meds ordered this encounter  Medications   aflibercept (EYLEA) SOLN 2 mg       Return in about 8 weeks (around 07/31/2021) for dilate, OD, EYLEA OCT.  There are no Patient Instructions on file for this visit.   Explained the diagnoses, plan, and follow up with the patient and they expressed understanding.  Patient expressed understanding of the importance of proper follow up care.   Clent Demark Tegh Franek M.D. Diseases & Surgery of the Retina and Vitreous [facility] [place] 06/05/21     Abbreviations: M myopia (nearsighted); A astigmatism; H hyperopia (farsighted); P presbyopia; Mrx spectacle prescription;  CTL contact lenses; OD right eye; OS left eye; OU both eyes  XT exotropia; ET esotropia; PEK punctate epithelial keratitis; PEE punctate epithelial erosions; DES dry eye syndrome; MGD meibomian gland dysfunction; ATs artificial tears; PFAT's preservative free artificial tears; [place] nuclear sclerotic cataract; PSC posterior subcapsular cataract; ERM epi-retinal membrane; PVD posterior vitreous detachment; RD retinal detachment; DM diabetes mellitus; DR diabetic retinopathy; NPDR non-proliferative diabetic retinopathy; PDR proliferative diabetic retinopathy; CSME clinically significant macular edema; DME diabetic macular edema; dbh dot blot hemorrhages; CWS cotton wool spot;  POAG primary open angle glaucoma; C/D cup-to-disc ratio; HVF humphrey visual field; GVF goldmann visual field; OCT optical coherence tomography; IOP intraocular pressure; BRVO Branch retinal vein occlusion; CRVO central retinal vein occlusion; CRAO central retinal artery occlusion; BRAO branch retinal artery occlusion; RT retinal tear; SB scleral buckle; PPV pars plana vitrectomy; VH Vitreous hemorrhage; PRP panretinal laser photocoagulation; IVK intravitreal kenalog; VMT vitreomacular traction; MH Macular hole;  NVD neovascularization of the disc; NVE neovascularization elsewhere; AREDS age related eye disease study; ARMD age related macular degeneration; POAG primary open angle glaucoma; EBMD epithelial/anterior basement membrane dystrophy; ACIOL anterior chamber intraocular lens; IOL intraocular lens; PCIOL posterior chamber intraocular lens; Phaco/IOL phacoemulsification with intraocular lens placement; [place] photorefractive keratectomy; LASIK laser assisted in situ keratomileusis; HTN hypertension; DM diabetes mellitus; COPD chronic obstructive pulmonary disease

## 2021-06-05 NOTE — Assessment & Plan Note (Signed)
Physiologic OU 

## 2021-07-30 DIAGNOSIS — C4442 Squamous cell carcinoma of skin of scalp and neck: Secondary | ICD-10-CM | POA: Diagnosis not present

## 2021-07-30 DIAGNOSIS — Z85828 Personal history of other malignant neoplasm of skin: Secondary | ICD-10-CM | POA: Diagnosis not present

## 2021-07-30 DIAGNOSIS — Z1283 Encounter for screening for malignant neoplasm of skin: Secondary | ICD-10-CM | POA: Diagnosis not present

## 2021-07-30 DIAGNOSIS — D485 Neoplasm of uncertain behavior of skin: Secondary | ICD-10-CM | POA: Diagnosis not present

## 2021-07-31 ENCOUNTER — Encounter (INDEPENDENT_AMBULATORY_CARE_PROVIDER_SITE_OTHER): Payer: Self-pay | Admitting: Ophthalmology

## 2021-07-31 ENCOUNTER — Ambulatory Visit (INDEPENDENT_AMBULATORY_CARE_PROVIDER_SITE_OTHER): Payer: Medicare Other | Admitting: Ophthalmology

## 2021-07-31 DIAGNOSIS — H353211 Exudative age-related macular degeneration, right eye, with active choroidal neovascularization: Secondary | ICD-10-CM

## 2021-07-31 MED ORDER — AFLIBERCEPT 2MG/0.05ML IZ SOLN FOR KALEIDOSCOPE
2.0000 mg | INTRAVITREAL | Status: AC | PRN
  Administered 2021-07-31: 2 mg via INTRAVITREAL

## 2021-07-31 NOTE — Progress Notes (Signed)
? ? ?07/31/2021 ? ?  ? ?CHIEF COMPLAINT ?Patient presents for  ?Chief Complaint  ?Patient presents with  ? Macular Degeneration  ? ? ? ? ?HISTORY OF PRESENT ILLNESS: ?Jeanette Cross is a 88 y.o. female who presents to the clinic today for:  ? ?HPI   ?8 weeks Dilate OD, Eylea OD, OCT. ?Patient states vision is stable and unchanged since last visit. Denies any new floaters or FOL. ?Pt states "it has been itching since the last injection, I put drops in when my eyes itch." ?Patient asks "could any of my medications I am on make my eyes dry and itch?" ?Last edited by Laurin Coder on 07/31/2021  1:54 PM.  ?  ? ? ?Referring physician: ?Monico Blitz, MD ?[address] ?[place],  [place] [address] ? ?HISTORICAL INFORMATION:  ? ?Selected notes from the [facility] ?  ?   ? ?CURRENT MEDICATIONS: ?No current outpatient medications on file. (Ophthalmic Drugs)  ? ?No current facility-administered medications for this visit. (Ophthalmic Drugs)  ? ?Current Outpatient Medications (Other)  ?Medication Sig  ? allopurinol (ZYLOPRIM) 100 MG tablet Take 100 mg by mouth daily.  ? levothyroxine (SYNTHROID) 50 MCG tablet Take 50 mcg by mouth daily.  ? lisinopril (ZESTRIL) 2.5 MG tablet Take 2.5 mg by mouth daily.  ? ?No current facility-administered medications for this visit. (Other)  ? ? ? ? ?REVIEW OF SYSTEMS: ?ROS   ?Negative for: Constitutional, Gastrointestinal, Neurological, Skin, Genitourinary, Musculoskeletal, HENT, Endocrine, Cardiovascular, Eyes, Respiratory, Psychiatric, Allergic/Imm, Heme/Lymph ?Last edited by Hurman Horn, MD on 07/31/2021  2:50 PM.  ?  ? ? ? ?ALLERGIES ?Allergies  ?Allergen Reactions  ? Novocain [Procaine]   ? Tylenol [Acetaminophen]   ? ? ?PAST MEDICAL HISTORY ?Past Medical History:  ?Diagnosis Date  ? Cancer [facility])   ? Hypertension   ? Macular degeneration   ? ?Past Surgical History:  ?Procedure Laterality Date  ? CATARACT EXTRACTION W/PHACO Bilateral   ? ? ?FAMILY HISTORY ?Family History  ?Problem  Relation Age of Onset  ? Hypertension Sister   ? ? ?SOCIAL HISTORY ?Social History  ? ?Tobacco Use  ? Smoking status: Never  ? Smokeless tobacco: Never  ? ?  ? ?  ? ?OPHTHALMIC EXAM: ? ?Base Eye Exam   ? ? Visual Acuity (ETDRS)   ? ?   Right Left  ? Dist [place] 20/40 +2   ? Dist ph [place] NI   ? ?  ?  ? ? Tonometry (Tonopen, 1:52 PM)   ? ?   Right Left  ? Pressure 4 12  ? ?  ?  ? ? Pupils   ? ?   Pupils Dark Light APD  ? Right PERRL 2 2 None  ? Left PERRL 3 2 None  ? ?  ?  ? ? Extraocular Movement   ? ?   Right Left  ?  Full Full  ? ?  ?  ? ? Neuro/Psych   ? ? Oriented x3: Yes  ? Mood/Affect: Normal  ? ?  ?  ? ? Dilation   ? ? Right eye: 1.0% Mydriacyl, 2.5% Phenylephrine @ 1:52 PM  ? ?  ?  ? ?  ? ?Slit Lamp and Fundus Exam   ? ? External Exam   ? ?   Right Left  ? External Normal Normal  ? ?  ?  ? ? Slit Lamp Exam   ? ?   Right Left  ? Lids/Lashes Normal Normal  ?  Conjunctiva/Sclera White and quiet White and quiet  ? Cornea Clear Clear  ? Anterior Chamber Deep and quiet Deep and quiet  ? Iris Round and reactive Round and reactive  ? Lens Posterior chamber intraocular lens Posterior chamber intraocular lens  ? Anterior Vitreous Normal Normal  ? ?  ?  ? ? Fundus Exam   ? ?   Right Left  ? Posterior Vitreous Posterior vitreous detachment Posterior vitreous detachment  ? Disc Normal Normal  ? C/D Ratio 0.4 0.4  ? Macula Geographic atrophy, Hard drusen, Retinal pigment epithelial mottling, no macular thickening Geographic atrophy , Hard drusen, Retinal pigment epithelial mottling, no macular thickening  ? Vessels Normal Normal  ? Periphery Normal Normal  ? ?  ?  ? ?  ? ? ?IMAGING AND PROCEDURES  ?Imaging and Procedures for 07/31/21 ? ?Intravitreal Injection, Pharmacologic Agent - OD - Right Eye   ? ?   ?Time Out ?07/31/2021. 2:53 PM. Confirmed correct patient, procedure, site, and patient consented.  ? ?Anesthesia ?Topical anesthesia was used. Anesthetic medications included Lidocaine 4%.  ? ?Procedure ?Preparation included 5%  betadine to ocular surface, Tobramycin 0.3%, Ofloxacin . A 30 gauge needle was used.  ? ?Injection: ?2 mg aflibercept 2 MG/0.05ML ?  Route: Intravitreal, Site: Right Eye ?  [place]: [id number], Lot: [id number], Waste: 0 mL  ? ?Post-op ?Post injection exam found visual acuity of at least counting fingers. The patient tolerated the procedure well. There were no complications. The patient received written and verbal post procedure care education. Post injection medications included ocuflox.  ? ?  ? ?Color Fundus Photography Optos - OU - Both Eyes   ? ?   ?Right Eye ?Progression has been stable. Disc findings include normal observations. Macula : geographic atrophy, retinal pigment epithelium abnormalities. Vessels : normal observations. Periphery : normal observations.  ? ?Left Eye ?Progression has been stable. Macula : retinal pigment epithelium abnormalities, geographic atrophy, drusen. Vessels : normal observations. Periphery : normal observations.  ? ?  ? ?OCT, Retina - OU - Both Eyes   ? ?   ?Right Eye ?Quality was good. Scan locations included subfoveal. Central Foveal Thickness: 262. Progression has improved. Findings include abnormal foveal contour, subretinal scarring, retinal drusen , no SRF, no IRF.  ? ?Left Eye ?Quality was good. Scan locations included subfoveal. Central Foveal Thickness: 284. Progression has been stable. Findings include retinal drusen , no SRF, no IRF.  ? ?Notes ?OD overall improved anatomy on an intravitreal Eylea compared to January 2022, and decreased intraretinal fluid and CME as compared to May 2022, and much less subretinal fluid and intraretinal fluid as compared to last visit February 2023 ? ?  ? ? ?  ?  ? ?  ?ASSESSMENT/PLAN: ? ?No problem-specific Assessment & Plan notes found for this encounter. ?  ? ?  ICD-10-CM   ?1. Exudative age-related macular degeneration of right eye with active choroidal neovascularization (HCC)  H35.3211 Intravitreal Injection, Pharmacologic Agent - OD  - Right Eye  ?  Color Fundus Photography Optos - OU - Both Eyes  ?  OCT, Retina - OU - Both Eyes  ?  aflibercept (EYLEA) SOLN 2 mg  ?  ? ? ?1.  OD anatomically doing very well and stable acuity.  Areas of intraretinal fluid temporal to the FAZ noted last and seen last January 2022, have resolved and maintain resolution at current 8-week interval post Eylea.  Repeat injection today ? ?2. ? ?3. ? ?Ophthalmic Meds Ordered this visit:  ?  Meds ordered this encounter  ?Medications  ? aflibercept (EYLEA) SOLN 2 mg  ? ? ?  ? ?Return in about 9 weeks (around 10/02/2021) for DILATE OU, EYLEA OCT, OD. ? ?There are no Patient Instructions on file for this visit. ? ? ?Explained the diagnoses, plan, and follow up with the patient and they expressed understanding.  Patient expressed understanding of the importance of proper follow up care.  ? ?Clent Demark. Sarah Baez M.D. ?Diseases & Surgery of the Retina and Vitreous ?[place] ?07/31/21 ? ? ? ? ?Abbreviations: ?M myopia (nearsighted); A astigmatism; H hyperopia (farsighted); P presbyopia; Mrx spectacle prescription;  CTL contact lenses; OD right eye; OS left eye; OU both eyes  XT exotropia; ET esotropia; PEK punctate epithelial keratitis; PEE punctate epithelial erosions; DES dry eye syndrome; MGD meibomian gland dysfunction; ATs artificial tears; PFAT's preservative free artificial tears; [place] nuclear sclerotic cataract; PSC posterior subcapsular cataract; ERM epi-retinal membrane; PVD posterior vitreous detachment; RD retinal detachment; DM diabetes mellitus; DR diabetic retinopathy; NPDR non-proliferative diabetic retinopathy; PDR proliferative diabetic retinopathy; CSME clinically significant macular edema; DME diabetic macular edema; dbh dot blot hemorrhages; CWS cotton wool spot; POAG primary open angle glaucoma; C/D cup-to-disc ratio; HVF humphrey visual field; GVF goldmann visual field; OCT optical coherence tomography; IOP intraocular pressure; BRVO Branch retinal  vein occlusion; CRVO central retinal vein occlusion; CRAO central retinal artery occlusion; BRAO branch retinal artery occlusion; RT retinal tear; SB scleral buckle; PPV pars plana vitrectomy; VH Vitreous h

## 2021-08-14 DIAGNOSIS — C4442 Squamous cell carcinoma of skin of scalp and neck: Secondary | ICD-10-CM | POA: Diagnosis not present

## 2021-10-09 ENCOUNTER — Encounter (INDEPENDENT_AMBULATORY_CARE_PROVIDER_SITE_OTHER): Payer: Self-pay | Admitting: Ophthalmology

## 2021-10-09 ENCOUNTER — Ambulatory Visit (INDEPENDENT_AMBULATORY_CARE_PROVIDER_SITE_OTHER): Payer: Medicare Other | Admitting: Ophthalmology

## 2021-10-09 DIAGNOSIS — H353114 Nonexudative age-related macular degeneration, right eye, advanced atrophic with subfoveal involvement: Secondary | ICD-10-CM

## 2021-10-09 DIAGNOSIS — H353122 Nonexudative age-related macular degeneration, left eye, intermediate dry stage: Secondary | ICD-10-CM

## 2021-10-09 DIAGNOSIS — H353211 Exudative age-related macular degeneration, right eye, with active choroidal neovascularization: Secondary | ICD-10-CM | POA: Diagnosis not present

## 2021-10-09 MED ORDER — AFLIBERCEPT 2MG/0.05ML IZ SOLN FOR KALEIDOSCOPE
2.0000 mg | INTRAVITREAL | Status: AC | PRN
  Administered 2021-10-09: 2 mg via INTRAVITREAL

## 2021-10-09 NOTE — Assessment & Plan Note (Signed)
Stable today at 10-week interval post Eylea we will repeat injection today and reexamine next in 10 weeks

## 2021-10-09 NOTE — Assessment & Plan Note (Signed)
Atrophy OD

## 2021-10-09 NOTE — Assessment & Plan Note (Signed)
OS doing very well, no sign of CNVM

## 2021-10-09 NOTE — Progress Notes (Signed)
10/09/2021     CHIEF COMPLAINT Patient presents for  Chief Complaint  Patient presents with   Macular Degeneration      HISTORY OF PRESENT ILLNESS: Jeanette Cross is a 88 y.o. female who presents to the clinic today for:   HPI   10 weeks for DILATE OU, EYLEA OCT, OD. Pt stated vision has been stable since last visit. Pt denies floaters and FOL.   Last edited by Silvestre Moment on 10/09/2021  2:45 PM.      Referring physician: Monico Blitz, MD [place],  [place] [address]  HISTORICAL INFORMATION:   Selected notes from the [facility]: No current outpatient medications on file. (Ophthalmic Drugs)   No current facility-administered medications for this visit. (Ophthalmic Drugs)   Current Outpatient Medications (Other)  Medication Sig   allopurinol (ZYLOPRIM) 100 MG tablet Take 100 mg by mouth daily.   levothyroxine (SYNTHROID) 50 MCG tablet Take 50 mcg by mouth daily.   lisinopril (ZESTRIL) 2.5 MG tablet Take 2.5 mg by mouth daily.   No current facility-administered medications for this visit. (Other)      REVIEW OF SYSTEMS: ROS   Negative for: Constitutional, Gastrointestinal, Neurological, Skin, Genitourinary, Musculoskeletal, HENT, Endocrine, Cardiovascular, Eyes, Respiratory, Psychiatric, Allergic/Imm, Heme/Lymph Last edited by Silvestre Moment on 10/09/2021  2:45 PM.       ALLERGIES Allergies  Allergen Reactions   Novocain [Procaine]    Tylenol [Acetaminophen]     PAST MEDICAL HISTORY Past Medical History:  Diagnosis Date   Cancer ([place])    Hypertension    Macular degeneration    Past Surgical History:  Procedure Laterality Date   CATARACT EXTRACTION W/PHACO Bilateral     FAMILY HISTORY Family History  Problem Relation Age of Onset   Hypertension Sister     SOCIAL HISTORY Social History   Tobacco Use   Smoking status: Never   Smokeless tobacco: Never         OPHTHALMIC EXAM:  Base Eye Exam      Visual Acuity (ETDRS)       Right Left   Dist [place] 20/40 20/40 +1   Dist ph [place] NI NI         Tonometry (Tonopen, 2:50 PM)       Right Left   Pressure 13 15         Pupils       Pupils APD   Right PERRL None   Left PERRL None         Visual Fields       Left Right    Full Full         Extraocular Movement       Right Left    Full Full         Neuro/Psych     Oriented x3: Yes   Mood/Affect: Normal         Dilation     Both eyes: 1.0% Mydriacyl, 2.5% Phenylephrine @ 2:50 PM           Slit Lamp and Fundus Exam     External Exam       Right Left   External Normal Normal         Slit Lamp Exam       Right Left   Lids/Lashes Normal Normal   Conjunctiva/Sclera White and quiet White and quiet   Cornea Clear Clear   Anterior Chamber Deep  and quiet Deep and quiet   Iris Round and reactive Round and reactive   Lens Posterior chamber intraocular lens Posterior chamber intraocular lens   Anterior Vitreous Normal Normal         Fundus Exam       Right Left   Posterior Vitreous Posterior vitreous detachment Posterior vitreous detachment   Disc Normal Normal   C/D Ratio 0.4 0.4   Macula Geographic atrophy, Hard drusen, Retinal pigment epithelial mottling, no macular thickening Geographic atrophy , Hard drusen, Retinal pigment epithelial mottling, no macular thickening   Vessels Normal Normal   Periphery Normal Normal            IMAGING AND PROCEDURES  Imaging and Procedures for 10/09/21  OCT, Retina - OU - Both Eyes       Right Eye Quality was good. Scan locations included subfoveal. Central Foveal Thickness: 275. Progression has improved. Findings include no IRF, no SRF, abnormal foveal contour, retinal drusen , subretinal scarring.   Left Eye Quality was good. Scan locations included subfoveal. Central Foveal Thickness: 284. Progression has been stable. Findings include no IRF, no SRF, retinal drusen .   Notes OD overall  improved anatomy on an intravitreal Eylea compared to January 2022, and decreased intraretinal fluid and CME as compared to May 2022, and much  subretinal fluid and intraretinal fluid as compared to last visit February 2023,, currently at 10-week follow-up interval      Intravitreal Injection, Pharmacologic Agent - OD - Right Eye       Time Out 10/09/2021. 3:08 PM. Confirmed correct patient, procedure, site, and patient consented.   Anesthesia Topical anesthesia was used. Anesthetic medications included Lidocaine 4%.   Procedure Preparation included 5% betadine to ocular surface, 10% betadine to eyelids, Tobramycin 0.3%, Ofloxacin . A 30 gauge needle was used.   Injection: 2 mg aflibercept 2 MG/0.05ML   Route: Intravitreal, Site: Right Eye   NDC: [id number], Lot: [id number], Waste: 0 mL   Post-op Post injection exam found visual acuity of at least counting fingers. The patient tolerated the procedure well. There were no complications. The patient received written and verbal post procedure care education. Post injection medications included ocuflox.              ASSESSMENT/PLAN:  Advanced nonexudative age-related macular degeneration of right eye with subfoveal involvement Atrophy OD  Exudative age-related macular degeneration of right eye with active choroidal neovascularization (HCC) Stable today at 10-week interval post Eylea we will repeat injection today and reexamine next in 10 weeks  Intermediate stage nonexudative age-related macular degeneration of left eye OS doing very well, no sign of CNVM     ICD-10-CM   1. Exudative age-related macular degeneration of right eye with active choroidal neovascularization (HCC)  H35.3211 OCT, Retina - OU - Both Eyes    Intravitreal Injection, Pharmacologic Agent - OD - Right Eye    aflibercept (EYLEA) SOLN 2 mg    2. Advanced nonexudative age-related macular degeneration of right eye with subfoveal involvement  H35.3114      3. Intermediate stage nonexudative age-related macular degeneration of left eye  H35.3122       1.  OD, stable overall with intraretinal fluid from CNVM but more importantly stable acuity.  At 10-week interval post Eylea.  Repeat injection today and reexamine next in 10 weeks  2.  OS no sign of CNVM  3.  Celebrated patient's 25 birthday with small bouquet of flowers in the small cheesecake  4.  Continue to monitor carefully and patient to return follow-up promptly if new acuity declines occur  Ophthalmic Meds Ordered this visit:  Meds ordered this encounter  Medications   aflibercept (EYLEA) SOLN 2 mg       Return in about 10 weeks (around 12/18/2021) for dilate, OD, EYLEA OCT.  There are no Patient Instructions on file for this visit.   Explained the diagnoses, plan, and follow up with the patient and they expressed understanding.  Patient expressed understanding of the importance of proper follow up care.   Clent Demark Antionio Negron M.D. Diseases & Surgery of the Retina and Vitreous Retina & Diabetic [place] 10/09/21     Abbreviations: M myopia (nearsighted); A astigmatism; H hyperopia (farsighted); P presbyopia; Mrx spectacle prescription;  CTL contact lenses; OD right eye; OS left eye; OU both eyes  XT exotropia; ET esotropia; PEK punctate epithelial keratitis; PEE punctate epithelial erosions; DES dry eye syndrome; MGD meibomian gland dysfunction; ATs artificial tears; PFAT's preservative free artificial tears; [place] nuclear sclerotic cataract; PSC posterior subcapsular cataract; ERM epi-retinal membrane; PVD posterior vitreous detachment; RD retinal detachment; DM diabetes mellitus; DR diabetic retinopathy; NPDR non-proliferative diabetic retinopathy; PDR proliferative diabetic retinopathy; CSME clinically significant macular edema; DME diabetic macular edema; dbh dot blot hemorrhages; CWS cotton wool spot; POAG primary open angle glaucoma; C/D cup-to-disc ratio; HVF humphrey  visual field; GVF goldmann visual field; OCT optical coherence tomography; IOP intraocular pressure; BRVO Branch retinal vein occlusion; CRVO central retinal vein occlusion; CRAO central retinal artery occlusion; BRAO branch retinal artery occlusion; RT retinal tear; SB scleral buckle; PPV pars plana vitrectomy; VH Vitreous hemorrhage; PRP panretinal laser photocoagulation; IVK intravitreal kenalog; VMT vitreomacular traction; MH Macular hole;  NVD neovascularization of the disc; NVE neovascularization elsewhere; AREDS age related eye disease study; ARMD age related macular degeneration; POAG primary open angle glaucoma; EBMD epithelial/anterior basement membrane dystrophy; ACIOL anterior chamber intraocular lens; IOL intraocular lens; PCIOL posterior chamber intraocular lens; Phaco/IOL phacoemulsification with intraocular lens placement; [place] photorefractive keratectomy; LASIK laser assisted in situ keratomileusis; HTN hypertension; DM diabetes mellitus; COPD chronic obstructive pulmonary disease

## 2021-10-13 ENCOUNTER — Encounter (INDEPENDENT_AMBULATORY_CARE_PROVIDER_SITE_OTHER): Payer: Medicare Other | Admitting: Ophthalmology

## 2021-10-20 DIAGNOSIS — H353290 Exudative age-related macular degeneration, unspecified eye, stage unspecified: Secondary | ICD-10-CM | POA: Diagnosis not present

## 2021-10-20 DIAGNOSIS — N2581 Secondary hyperparathyroidism of renal origin: Secondary | ICD-10-CM | POA: Diagnosis not present

## 2021-10-20 DIAGNOSIS — E7800 Pure hypercholesterolemia, unspecified: Secondary | ICD-10-CM | POA: Diagnosis not present

## 2021-10-20 DIAGNOSIS — Z789 Other specified health status: Secondary | ICD-10-CM | POA: Diagnosis not present

## 2021-10-20 DIAGNOSIS — I10 Essential (primary) hypertension: Secondary | ICD-10-CM | POA: Diagnosis not present

## 2021-10-20 DIAGNOSIS — Z299 Encounter for prophylactic measures, unspecified: Secondary | ICD-10-CM | POA: Diagnosis not present

## 2021-10-20 DIAGNOSIS — Z6822 Body mass index (BMI) 22.0-22.9, adult: Secondary | ICD-10-CM | POA: Diagnosis not present

## 2021-12-18 ENCOUNTER — Encounter (INDEPENDENT_AMBULATORY_CARE_PROVIDER_SITE_OTHER): Payer: Self-pay | Admitting: Ophthalmology

## 2021-12-18 ENCOUNTER — Ambulatory Visit (INDEPENDENT_AMBULATORY_CARE_PROVIDER_SITE_OTHER): Payer: Medicare Other | Admitting: Ophthalmology

## 2021-12-18 DIAGNOSIS — D485 Neoplasm of uncertain behavior of skin: Secondary | ICD-10-CM | POA: Diagnosis not present

## 2021-12-18 DIAGNOSIS — H353114 Nonexudative age-related macular degeneration, right eye, advanced atrophic with subfoveal involvement: Secondary | ICD-10-CM

## 2021-12-18 DIAGNOSIS — H353122 Nonexudative age-related macular degeneration, left eye, intermediate dry stage: Secondary | ICD-10-CM | POA: Diagnosis not present

## 2021-12-18 DIAGNOSIS — C4442 Squamous cell carcinoma of skin of scalp and neck: Secondary | ICD-10-CM | POA: Diagnosis not present

## 2021-12-18 DIAGNOSIS — Z85828 Personal history of other malignant neoplasm of skin: Secondary | ICD-10-CM | POA: Diagnosis not present

## 2021-12-18 DIAGNOSIS — H353211 Exudative age-related macular degeneration, right eye, with active choroidal neovascularization: Secondary | ICD-10-CM | POA: Diagnosis not present

## 2021-12-18 MED ORDER — AFLIBERCEPT 2MG/0.05ML IZ SOLN FOR KALEIDOSCOPE
2.0000 mg | INTRAVITREAL | Status: AC | PRN
  Administered 2021-12-18: 2 mg via INTRAVITREAL

## 2021-12-18 NOTE — Assessment & Plan Note (Signed)
OD, intraretinal fluid has diminished but still active temporally is over a region of atrophy stable at 1210-week interval.  Repeat injection today follow-up next in 3 months

## 2021-12-18 NOTE — Assessment & Plan Note (Signed)
NO CNVM

## 2021-12-18 NOTE — Progress Notes (Signed)
12/18/2021     CHIEF COMPLAINT Patient presents for  Chief Complaint  Patient presents with   Macular Degeneration      HISTORY OF PRESENT ILLNESS: Jeanette Cross is a 88 y.o. female who presents to the clinic today for:   HPI   10 weeks dilate od eylea oct interval change in vision Pt states her vision has been stable Pt denies any new floaters or FOL  Last edited by Hurman Horn, MD on 12/18/2021  3:16 PM.      Referring physician: Monico Blitz, MD [place],  [place] [address]  HISTORICAL INFORMATION:   Selected notes from the [facility]: No current outpatient medications on file. (Ophthalmic Drugs)   No current facility-administered medications for this visit. (Ophthalmic Drugs)   Current Outpatient Medications (Other)  Medication Sig   allopurinol (ZYLOPRIM) 100 MG tablet Take 100 mg by mouth daily.   levothyroxine (SYNTHROID) 50 MCG tablet Take 50 mcg by mouth daily.   lisinopril (ZESTRIL) 2.5 MG tablet Take 2.5 mg by mouth daily.   No current facility-administered medications for this visit. (Other)      REVIEW OF SYSTEMS: ROS   Negative for: Constitutional, Gastrointestinal, Neurological, Skin, Genitourinary, Musculoskeletal, HENT, Endocrine, Cardiovascular, Eyes, Respiratory, Psychiatric, Allergic/Imm, Heme/Lymph Last edited by Morene Rankins, CMA on 12/18/2021  2:33 PM.       ALLERGIES Allergies  Allergen Reactions   Novocain [Procaine]    Tylenol [Acetaminophen]     PAST MEDICAL HISTORY Past Medical History:  Diagnosis Date   Cancer ([place])    Hypertension    Macular degeneration    Past Surgical History:  Procedure Laterality Date   CATARACT EXTRACTION W/PHACO Bilateral     FAMILY HISTORY Family History  Problem Relation Age of Onset   Hypertension Sister     SOCIAL HISTORY Social History   Tobacco Use   Smoking status: Never   Smokeless tobacco: Never          OPHTHALMIC EXAM:  Base Eye Exam     Visual Acuity (ETDRS)       Right Left   Dist [place] 20/25 20/25         Tonometry (Tonopen, 2:41 PM)       Right Left   Pressure 14 8         Pupils       Pupils APD   Right PERRL None   Left PERRL          Visual Fields       Left Right    Full Full         Neuro/Psych     Oriented x3: Yes   Mood/Affect: Normal         Dilation     Right eye: 2.5% Phenylephrine, 1.0% Mydriacyl @ 2:34 PM           Slit Lamp and Fundus Exam     External Exam       Right Left   External Normal Normal         Slit Lamp Exam       Right Left   Lids/Lashes Normal Normal   Conjunctiva/Sclera White and quiet White and quiet   Cornea Clear Clear   Anterior Chamber Deep and quiet Deep and quiet   Iris Round and reactive Round and reactive   Lens Posterior chamber intraocular lens Posterior chamber intraocular lens  Anterior Vitreous Normal Normal         Fundus Exam       Right Left   Posterior Vitreous Posterior vitreous detachment    Disc Normal    C/D Ratio 0.4    Macula Geographic atrophy, Hard drusen, Retinal pigment epithelial mottling, no macular thickening    Vessels Normal    Periphery Normal             IMAGING AND PROCEDURES  Imaging and Procedures for 12/18/21  OCT, Retina - OU - Both Eyes       Right Eye Quality was good. Scan locations included subfoveal. Central Foveal Thickness: 275. Progression has been stable. Findings include no IRF, no SRF, abnormal foveal contour, retinal drusen , subretinal scarring.   Left Eye Quality was good. Scan locations included subfoveal. Central Foveal Thickness: 284. Progression has been stable. Findings include no IRF, no SRF, retinal drusen .   Notes OD overall improved anatomy on an intravitreal Eylea compared to January 2022, and decreased intraretinal fluid and CME as compared to May 2022, and much  subretinal fluid and intraretinal fluid as  compared to last visit February 2023,, currently at 10-week follow-up interval     Intravitreal Injection, Pharmacologic Agent - OD - Right Eye       Time Out 12/18/2021. 3:14 PM. Confirmed correct patient, procedure, site, and patient consented.   Anesthesia Topical anesthesia was used. Anesthetic medications included Lidocaine 4%.   Procedure Preparation included 5% betadine to ocular surface, 10% betadine to eyelids, Tobramycin 0.3%, Ofloxacin . A 30 gauge needle was used.   Injection: 2 mg aflibercept 2 MG/0.05ML   Route: Intravitreal, Site: Right Eye   NDC: [id number], Lot: [id number], Expiration date: 01/04/2023, Waste: 0 mL   Post-op Post injection exam found visual acuity of at least counting fingers. The patient tolerated the procedure well. There were no complications. The patient received written and verbal post procedure care education. Post injection medications included ocuflox.              ASSESSMENT/PLAN:  Exudative age-related macular degeneration of right eye with active choroidal neovascularization (HCC) OD, intraretinal fluid has diminished but still active temporally is over a region of atrophy stable at 1210-week interval.  Repeat injection today follow-up next in 3 months  Advanced nonexudative age-related macular degeneration of right eye with subfoveal involvement Stable OD  Intermediate stage nonexudative age-related macular degeneration of left eye NO CNVM     ICD-10-CM   1. Exudative age-related macular degeneration of right eye with active choroidal neovascularization (HCC)  H35.3211 OCT, Retina - OU - Both Eyes    Intravitreal Injection, Pharmacologic Agent - OD - Right Eye    aflibercept (EYLEA) SOLN 2 mg    2. Advanced nonexudative age-related macular degeneration of right eye with subfoveal involvement  H35.3114     3. Intermediate stage nonexudative age-related macular degeneration of left eye  H35.3122       1.  OD, vastly  improved and stable intraretinal fluid temporally maintained at 10-week interval today post Eylea.  Repeat injection today and reevaluate next in 3 months  2.  No sign of CNVM OS  3.  Ophthalmic Meds Ordered this visit:  Meds ordered this encounter  Medications   aflibercept (EYLEA) SOLN 2 mg       Return in about 3 months (around 03/20/2022) for DILATE OU, AVASTIN OCT, OD.  There are no Patient Instructions on file for this visit.   Explained  the diagnoses, plan, and follow up with the patient and they expressed understanding.  Patient expressed understanding of the importance of proper follow up care.   Clent Demark Keiley Levey M.D. Diseases & Surgery of the Retina and Vitreous Retina & Diabetic [place] 12/18/21     Abbreviations: M myopia (nearsighted); A astigmatism; H hyperopia (farsighted); P presbyopia; Mrx spectacle prescription;  CTL contact lenses; OD right eye; OS left eye; OU both eyes  XT exotropia; ET esotropia; PEK punctate epithelial keratitis; PEE punctate epithelial erosions; DES dry eye syndrome; MGD meibomian gland dysfunction; ATs artificial tears; PFAT's preservative free artificial tears; [place] nuclear sclerotic cataract; PSC posterior subcapsular cataract; ERM epi-retinal membrane; PVD posterior vitreous detachment; RD retinal detachment; DM diabetes mellitus; DR diabetic retinopathy; NPDR non-proliferative diabetic retinopathy; PDR proliferative diabetic retinopathy; CSME clinically significant macular edema; DME diabetic macular edema; dbh dot blot hemorrhages; CWS cotton wool spot; POAG primary open angle glaucoma; C/D cup-to-disc ratio; HVF humphrey visual field; GVF goldmann visual field; OCT optical coherence tomography; IOP intraocular pressure; BRVO Branch retinal vein occlusion; CRVO central retinal vein occlusion; CRAO central retinal artery occlusion; BRAO branch retinal artery occlusion; RT retinal tear; SB scleral buckle; PPV pars plana vitrectomy; VH  Vitreous hemorrhage; PRP panretinal laser photocoagulation; IVK intravitreal kenalog; VMT vitreomacular traction; MH Macular hole;  NVD neovascularization of the disc; NVE neovascularization elsewhere; AREDS age related eye disease study; ARMD age related macular degeneration; POAG primary open angle glaucoma; EBMD epithelial/anterior basement membrane dystrophy; ACIOL anterior chamber intraocular lens; IOL intraocular lens; PCIOL posterior chamber intraocular lens; Phaco/IOL phacoemulsification with intraocular lens placement; [place] photorefractive keratectomy; LASIK laser assisted in situ keratomileusis; HTN hypertension; DM diabetes mellitus; COPD chronic obstructive pulmonary disease

## 2021-12-18 NOTE — Assessment & Plan Note (Signed)
Stable OD °

## 2022-02-11 DIAGNOSIS — C4442 Squamous cell carcinoma of skin of scalp and neck: Secondary | ICD-10-CM | POA: Diagnosis not present

## 2022-02-16 DIAGNOSIS — Z48817 Encounter for surgical aftercare following surgery on the skin and subcutaneous tissue: Secondary | ICD-10-CM | POA: Diagnosis not present

## 2022-02-16 DIAGNOSIS — Z85828 Personal history of other malignant neoplasm of skin: Secondary | ICD-10-CM | POA: Diagnosis not present

## 2022-02-16 DIAGNOSIS — S0100XA Unspecified open wound of scalp, initial encounter: Secondary | ICD-10-CM | POA: Diagnosis not present

## 2022-02-23 DIAGNOSIS — Z85828 Personal history of other malignant neoplasm of skin: Secondary | ICD-10-CM | POA: Diagnosis not present

## 2022-02-23 DIAGNOSIS — Z48817 Encounter for surgical aftercare following surgery on the skin and subcutaneous tissue: Secondary | ICD-10-CM | POA: Diagnosis not present

## 2022-02-23 DIAGNOSIS — S0100XD Unspecified open wound of scalp, subsequent encounter: Secondary | ICD-10-CM | POA: Diagnosis not present

## 2022-02-24 DIAGNOSIS — H353210 Exudative age-related macular degeneration, right eye, stage unspecified: Secondary | ICD-10-CM | POA: Diagnosis not present

## 2022-02-24 DIAGNOSIS — Z1339 Encounter for screening examination for other mental health and behavioral disorders: Secondary | ICD-10-CM | POA: Diagnosis not present

## 2022-02-24 DIAGNOSIS — Z299 Encounter for prophylactic measures, unspecified: Secondary | ICD-10-CM | POA: Diagnosis not present

## 2022-02-24 DIAGNOSIS — Z0000 Encounter for general adult medical examination without abnormal findings: Secondary | ICD-10-CM | POA: Diagnosis not present

## 2022-02-24 DIAGNOSIS — Z23 Encounter for immunization: Secondary | ICD-10-CM | POA: Diagnosis not present

## 2022-02-24 DIAGNOSIS — Z79899 Other long term (current) drug therapy: Secondary | ICD-10-CM | POA: Diagnosis not present

## 2022-02-24 DIAGNOSIS — R5383 Other fatigue: Secondary | ICD-10-CM | POA: Diagnosis not present

## 2022-02-24 DIAGNOSIS — E7800 Pure hypercholesterolemia, unspecified: Secondary | ICD-10-CM | POA: Diagnosis not present

## 2022-02-24 DIAGNOSIS — Z7189 Other specified counseling: Secondary | ICD-10-CM | POA: Diagnosis not present

## 2022-02-24 DIAGNOSIS — E039 Hypothyroidism, unspecified: Secondary | ICD-10-CM | POA: Diagnosis not present

## 2022-02-24 DIAGNOSIS — Z1331 Encounter for screening for depression: Secondary | ICD-10-CM | POA: Diagnosis not present

## 2022-02-24 DIAGNOSIS — E559 Vitamin D deficiency, unspecified: Secondary | ICD-10-CM | POA: Diagnosis not present

## 2022-02-24 DIAGNOSIS — Z6823 Body mass index (BMI) 23.0-23.9, adult: Secondary | ICD-10-CM | POA: Diagnosis not present

## 2022-02-24 DIAGNOSIS — Z66 Do not resuscitate: Secondary | ICD-10-CM | POA: Diagnosis not present

## 2022-03-02 DIAGNOSIS — L928 Other granulomatous disorders of the skin and subcutaneous tissue: Secondary | ICD-10-CM | POA: Diagnosis not present

## 2022-03-02 DIAGNOSIS — Z4801 Encounter for change or removal of surgical wound dressing: Secondary | ICD-10-CM | POA: Diagnosis not present

## 2022-03-02 DIAGNOSIS — S0100XA Unspecified open wound of scalp, initial encounter: Secondary | ICD-10-CM | POA: Diagnosis not present

## 2022-03-23 ENCOUNTER — Encounter (INDEPENDENT_AMBULATORY_CARE_PROVIDER_SITE_OTHER): Payer: Medicare Other | Admitting: Ophthalmology

## 2022-03-23 DIAGNOSIS — H43813 Vitreous degeneration, bilateral: Secondary | ICD-10-CM | POA: Diagnosis not present

## 2022-03-23 DIAGNOSIS — H35051 Retinal neovascularization, unspecified, right eye: Secondary | ICD-10-CM | POA: Diagnosis not present

## 2022-03-23 DIAGNOSIS — H353114 Nonexudative age-related macular degeneration, right eye, advanced atrophic with subfoveal involvement: Secondary | ICD-10-CM | POA: Diagnosis not present

## 2022-03-23 DIAGNOSIS — H353211 Exudative age-related macular degeneration, right eye, with active choroidal neovascularization: Secondary | ICD-10-CM | POA: Diagnosis not present

## 2022-03-23 DIAGNOSIS — H353122 Nonexudative age-related macular degeneration, left eye, intermediate dry stage: Secondary | ICD-10-CM | POA: Diagnosis not present

## 2022-04-06 DIAGNOSIS — S0100XA Unspecified open wound of scalp, initial encounter: Secondary | ICD-10-CM | POA: Diagnosis not present

## 2022-05-12 DIAGNOSIS — N2581 Secondary hyperparathyroidism of renal origin: Secondary | ICD-10-CM | POA: Diagnosis not present

## 2022-05-12 DIAGNOSIS — R600 Localized edema: Secondary | ICD-10-CM | POA: Diagnosis not present

## 2022-05-12 DIAGNOSIS — H353210 Exudative age-related macular degeneration, right eye, stage unspecified: Secondary | ICD-10-CM | POA: Diagnosis not present

## 2022-05-12 DIAGNOSIS — H353290 Exudative age-related macular degeneration, unspecified eye, stage unspecified: Secondary | ICD-10-CM | POA: Diagnosis not present

## 2022-06-22 DIAGNOSIS — H353122 Nonexudative age-related macular degeneration, left eye, intermediate dry stage: Secondary | ICD-10-CM | POA: Diagnosis not present

## 2022-06-22 DIAGNOSIS — H43813 Vitreous degeneration, bilateral: Secondary | ICD-10-CM | POA: Diagnosis not present

## 2022-06-22 DIAGNOSIS — H353211 Exudative age-related macular degeneration, right eye, with active choroidal neovascularization: Secondary | ICD-10-CM | POA: Diagnosis not present

## 2022-07-22 ENCOUNTER — Emergency Department (HOSPITAL_COMMUNITY): Payer: Medicare Other

## 2022-07-22 ENCOUNTER — Other Ambulatory Visit: Payer: Self-pay

## 2022-07-22 ENCOUNTER — Encounter (HOSPITAL_COMMUNITY): Payer: Self-pay

## 2022-07-22 ENCOUNTER — Emergency Department (HOSPITAL_COMMUNITY)
Admission: EM | Admit: 2022-07-22 | Discharge: 2022-07-22 | Disposition: A | Discharge status: Home / Self Care | Payer: Medicare Other | Attending: Emergency Medicine | Admitting: Emergency Medicine

## 2022-07-22 DIAGNOSIS — R519 Headache, unspecified: Secondary | ICD-10-CM | POA: Diagnosis not present

## 2022-07-22 DIAGNOSIS — S40012A Contusion of left shoulder, initial encounter: Secondary | ICD-10-CM

## 2022-07-22 DIAGNOSIS — M25519 Pain in unspecified shoulder: Secondary | ICD-10-CM | POA: Diagnosis not present

## 2022-07-22 DIAGNOSIS — Y92000 Kitchen of unspecified non-institutional (private) residence as  the place of occurrence of the external cause: Secondary | ICD-10-CM | POA: Insufficient documentation

## 2022-07-22 DIAGNOSIS — S0083XA Contusion of other part of head, initial encounter: Secondary | ICD-10-CM | POA: Diagnosis not present

## 2022-07-22 DIAGNOSIS — S0990XA Unspecified injury of head, initial encounter: Secondary | ICD-10-CM | POA: Diagnosis not present

## 2022-07-22 DIAGNOSIS — E039 Hypothyroidism, unspecified: Secondary | ICD-10-CM | POA: Insufficient documentation

## 2022-07-22 DIAGNOSIS — M542 Cervicalgia: Secondary | ICD-10-CM | POA: Diagnosis not present

## 2022-07-22 DIAGNOSIS — S7002XA Contusion of left hip, initial encounter: Secondary | ICD-10-CM

## 2022-07-22 DIAGNOSIS — Z79899 Other long term (current) drug therapy: Secondary | ICD-10-CM | POA: Insufficient documentation

## 2022-07-22 DIAGNOSIS — S134XXA Sprain of ligaments of cervical spine, initial encounter: Secondary | ICD-10-CM | POA: Insufficient documentation

## 2022-07-22 DIAGNOSIS — I10 Essential (primary) hypertension: Secondary | ICD-10-CM | POA: Insufficient documentation

## 2022-07-22 DIAGNOSIS — M25512 Pain in left shoulder: Secondary | ICD-10-CM | POA: Diagnosis not present

## 2022-07-22 DIAGNOSIS — M25552 Pain in left hip: Secondary | ICD-10-CM | POA: Diagnosis not present

## 2022-07-22 DIAGNOSIS — W01198A Fall on same level from slipping, tripping and stumbling with subsequent striking against other object, initial encounter: Secondary | ICD-10-CM | POA: Insufficient documentation

## 2022-07-22 DIAGNOSIS — W19XXXA Unspecified fall, initial encounter: Secondary | ICD-10-CM

## 2022-07-22 NOTE — ED Notes (Signed)
C-Collar applied

## 2022-07-22 NOTE — ED Provider Notes (Signed)
[facility] Provider Note   CSN: [id number] Arrival date & time: 07/22/22  0146     History  Chief Complaint  Patient presents with   Jeanette Cross is a 89 y.o. female.  Patient is a 89 year old female with past medical history of hypertension, hypothyroidism.  Patient brought by EMS after a fall that occurred at home.  Patient apparently was making a biscuit when she lost her balance and fell.  She apparently struck her face on the cabinet.  She was brought here complaining of pain in the left hip and shoulder.  There was no reported loss of consciousness, but patient is complaining of headache and neck pain as well.  According to the daughter at bedside, patient lives at home by herself.  The history is provided by the patient and a relative.       Home Medications Prior to Admission medications   Medication Sig Start Date End Date Taking? Authorizing Provider  allopurinol (ZYLOPRIM) 100 MG tablet Take 100 mg by mouth daily. 06/30/19   [provider]  levothyroxine (SYNTHROID) 50 MCG tablet Take 50 mcg by mouth daily. 06/28/19   [provider]  lisinopril (ZESTRIL) 2.5 MG tablet Take 2.5 mg by mouth daily. 06/28/19   [provider]      Allergies    Novocain [procaine] and Tylenol [acetaminophen]    Review of Systems   Review of Systems  All other systems reviewed and are negative.   Physical Exam Updated Vital Signs BP (!) 152/95   Pulse 98   Temp 98.3 F (36.8 C)   Resp 16   Ht 5\' 4"  (1.626 m)   Wt 61.2 kg   SpO2 92%   BMI 23.17 kg/m  Physical Exam Vitals and nursing note reviewed.  Constitutional:      General: She is not in acute distress.    Appearance: She is well-developed. She is not diaphoretic.  HENT:     Head: Normocephalic.     Comments: There is some bruising noted to the right upper lip.  She has dentures in place.  There is also some dried blood in the  nares, but no deformity or septal hematoma. Eyes:     Extraocular Movements: Extraocular movements intact.     Pupils: Pupils are equal, round, and reactive to light.  Cardiovascular:     Rate and Rhythm: Normal rate and regular rhythm.     Heart sounds: No murmur heard.    No friction rub. No gallop.  Pulmonary:     Effort: Pulmonary effort is normal. No respiratory distress.     Breath sounds: Normal breath sounds. No wheezing.  Abdominal:     General: Bowel sounds are normal. There is no distension.     Palpations: Abdomen is soft.     Tenderness: There is no abdominal tenderness.  Musculoskeletal:        General: Normal range of motion.     Cervical back: Normal range of motion and neck supple.  Skin:    General: Skin is warm and dry.  Neurological:     General: No focal deficit present.     Mental Status: She is alert and oriented to person, place, and time.     Cranial Nerves: No cranial nerve deficit.     Motor: No weakness.     ED Results / Procedures / Treatments   Labs (all labs ordered are listed, but  only abnormal results are displayed) Labs Reviewed - No data to display  EKG None  Radiology DG Shoulder Left  Result Date: 07/22/2022 CLINICAL DATA:  One 89 year old female status post fall with left shoulder pain. EXAM: LEFT SHOULDER - 2+ VIEW COMPARISON:  None Available. FINDINGS: Two views at 0343 hours. Bone mineralization is within normal limits for age. No glenohumeral joint dislocation. Proximal left humerus appears intact. Grossly intact visible left clavicle and scapula, with acromioclavicular alignment. Calcified aortic atherosclerosis. Visible left ribs appear intact, left lung aerated. IMPRESSION: 1. No acute fracture or dislocation identified about the left shoulder. 2.  Aortic Atherosclerosis (ICD10-I70.0). Electronically Signed   By: Genevie Ann M.D.   On: 07/22/2022 04:00   DG Hip Unilat W or Wo Pelvis 2-3 Views Left  Result Date:  07/22/2022 CLINICAL DATA:  One 89 year old female status post fall. Left hip pain. EXAM: DG HIP (WITH OR WITHOUT PELVIS) 2-3V LEFT COMPARISON:  None Available. FINDINGS: AP view of the pelvis at 0341 hours. Femoral heads are normally located. Urinary catheter projects over the central pelvis. Iliofemoral calcified atherosclerosis. No pelvis fracture identified. SI joints and pubic symphysis appear within normal limits. Grossly intact proximal right femur. AP and frogleg lateral views of the left hip. Proximal left femur appears intact. Left femoral artery calcified atherosclerosis. Negative lower abdominal and pelvic visceral contours. IMPRESSION: 1. No acute fracture or dislocation identified about the left hip or pelvis. 2. Iliofemoral calcified atherosclerosis. Electronically Signed   By: Genevie Ann M.D.   On: 07/22/2022 03:59    Procedures Procedures    Medications Ordered in ED Medications - No data to display  ED Course/ Medical Decision Making/ A&P  Patient is a 89 year old female presenting for evaluation of a fall.  Patient apparently fell in the kitchen while making a biscuit and struck her head on a cabinet.  There was no loss of consciousness, but she did have some bleeding from the nose and is experiencing pain in her neck, left shoulder, and left hip.  Patient arrives here with stable vital signs and physical examination as above.  Imaging studies performed including CT scan of the head, cervical spine, and maxillofacial bones.  This shows prevertebral edema at the level of C3-C4 consistent with possible ligamentous injury.  This finding was discussed with neurosurgery who has recommended an Aspen collar and follow-up in the neurosurgery clinic in the next 2 weeks.  Patient was ambulatory in the emergency department without assistance and daughter at bedside is comfortable with taking her home.  Patient to be discharged with outpatient follow-up.  Final Clinical Impression(s) / ED  Diagnoses Final diagnoses:  None    Rx / DC Orders ED Discharge Orders     None         Veryl Speak, MD 07/22/22 [phone]

## 2022-07-22 NOTE — Discharge Instructions (Signed)
Take Tylenol 650 mg every 6 hours as needed for pain.  You are to follow-up in the neurosurgery clinic in 2 weeks.  The contact information for the [facility] neurosurgical clinic has been provided in this discharge summary for you to call and make these arrangements.  Return to the ER if you experience any new and/or concerning symptoms.

## 2022-07-22 NOTE — ED Triage Notes (Addendum)
Pt presents with fall tonight after stumbling in kitchen. Pt now complaining of left hip, shoulder and face pain. Pt hit nose on kitchen counter. No blood thinners.

## 2022-07-22 NOTE — ED Notes (Signed)
Pt ambulated around room with x 1 assistance.

## 2022-07-29 ENCOUNTER — Telehealth: Payer: Self-pay

## 2022-07-29 NOTE — Telephone Encounter (Signed)
        Patient  visited [place] on 3/20    Telephone encounter attempt :  1st  A HIPAA compliant voice message was left requesting a return call.  Instructed patient to call back .    [facility] [phone] [address], [place] [address] Phone: [phone] Email: [email or web address]

## 2022-08-07 DIAGNOSIS — G8929 Other chronic pain: Secondary | ICD-10-CM | POA: Diagnosis not present

## 2022-08-07 DIAGNOSIS — Z681 Body mass index (BMI) 19 or less, adult: Secondary | ICD-10-CM | POA: Diagnosis not present

## 2022-08-07 DIAGNOSIS — S32010A Wedge compression fracture of first lumbar vertebra, initial encounter for closed fracture: Secondary | ICD-10-CM | POA: Diagnosis not present

## 2022-08-17 DIAGNOSIS — H43813 Vitreous degeneration, bilateral: Secondary | ICD-10-CM | POA: Diagnosis not present

## 2022-08-17 DIAGNOSIS — H353211 Exudative age-related macular degeneration, right eye, with active choroidal neovascularization: Secondary | ICD-10-CM | POA: Diagnosis not present

## 2022-08-17 DIAGNOSIS — H353123 Nonexudative age-related macular degeneration, left eye, advanced atrophic without subfoveal involvement: Secondary | ICD-10-CM | POA: Diagnosis not present

## 2022-08-26 DIAGNOSIS — I700 Atherosclerosis of aorta: Secondary | ICD-10-CM | POA: Diagnosis not present

## 2022-08-26 DIAGNOSIS — I10 Essential (primary) hypertension: Secondary | ICD-10-CM | POA: Diagnosis not present

## 2022-08-26 DIAGNOSIS — M810 Age-related osteoporosis without current pathological fracture: Secondary | ICD-10-CM | POA: Diagnosis not present

## 2022-08-26 DIAGNOSIS — Z299 Encounter for prophylactic measures, unspecified: Secondary | ICD-10-CM | POA: Diagnosis not present

## 2022-08-26 DIAGNOSIS — L570 Actinic keratosis: Secondary | ICD-10-CM | POA: Diagnosis not present

## 2022-10-12 DIAGNOSIS — H353114 Nonexudative age-related macular degeneration, right eye, advanced atrophic with subfoveal involvement: Secondary | ICD-10-CM | POA: Diagnosis not present

## 2022-10-12 DIAGNOSIS — H43813 Vitreous degeneration, bilateral: Secondary | ICD-10-CM | POA: Diagnosis not present

## 2022-10-12 DIAGNOSIS — H353211 Exudative age-related macular degeneration, right eye, with active choroidal neovascularization: Secondary | ICD-10-CM | POA: Diagnosis not present

## 2022-10-12 DIAGNOSIS — H353123 Nonexudative age-related macular degeneration, left eye, advanced atrophic without subfoveal involvement: Secondary | ICD-10-CM | POA: Diagnosis not present

## 2022-11-25 DIAGNOSIS — Z299 Encounter for prophylactic measures, unspecified: Secondary | ICD-10-CM | POA: Diagnosis not present

## 2022-11-25 DIAGNOSIS — I10 Essential (primary) hypertension: Secondary | ICD-10-CM | POA: Diagnosis not present

## 2022-11-25 DIAGNOSIS — L03116 Cellulitis of left lower limb: Secondary | ICD-10-CM | POA: Diagnosis not present

## 2022-11-25 DIAGNOSIS — S32010A Wedge compression fracture of first lumbar vertebra, initial encounter for closed fracture: Secondary | ICD-10-CM | POA: Diagnosis not present

## 2022-12-07 DIAGNOSIS — Z299 Encounter for prophylactic measures, unspecified: Secondary | ICD-10-CM | POA: Diagnosis not present

## 2022-12-07 DIAGNOSIS — H353123 Nonexudative age-related macular degeneration, left eye, advanced atrophic without subfoveal involvement: Secondary | ICD-10-CM | POA: Diagnosis not present

## 2022-12-07 DIAGNOSIS — H353211 Exudative age-related macular degeneration, right eye, with active choroidal neovascularization: Secondary | ICD-10-CM | POA: Diagnosis not present

## 2022-12-07 DIAGNOSIS — I10 Essential (primary) hypertension: Secondary | ICD-10-CM | POA: Diagnosis not present

## 2022-12-07 DIAGNOSIS — H43813 Vitreous degeneration, bilateral: Secondary | ICD-10-CM | POA: Diagnosis not present

## 2022-12-07 DIAGNOSIS — N184 Chronic kidney disease, stage 4 (severe): Secondary | ICD-10-CM | POA: Diagnosis not present

## 2022-12-07 DIAGNOSIS — R600 Localized edema: Secondary | ICD-10-CM | POA: Diagnosis not present

## 2022-12-07 DIAGNOSIS — H353114 Nonexudative age-related macular degeneration, right eye, advanced atrophic with subfoveal involvement: Secondary | ICD-10-CM | POA: Diagnosis not present

## 2022-12-07 DIAGNOSIS — L0390 Cellulitis, unspecified: Secondary | ICD-10-CM | POA: Diagnosis not present

## 2022-12-25 DIAGNOSIS — L0390 Cellulitis, unspecified: Secondary | ICD-10-CM | POA: Diagnosis not present

## 2022-12-25 DIAGNOSIS — Z299 Encounter for prophylactic measures, unspecified: Secondary | ICD-10-CM | POA: Diagnosis not present

## 2022-12-25 DIAGNOSIS — I10 Essential (primary) hypertension: Secondary | ICD-10-CM | POA: Diagnosis not present

## 2022-12-28 DIAGNOSIS — L0390 Cellulitis, unspecified: Secondary | ICD-10-CM | POA: Diagnosis not present

## 2022-12-30 DIAGNOSIS — I129 Hypertensive chronic kidney disease with stage 1 through stage 4 chronic kidney disease, or unspecified chronic kidney disease: Secondary | ICD-10-CM | POA: Diagnosis not present

## 2022-12-30 DIAGNOSIS — N281 Cyst of kidney, acquired: Secondary | ICD-10-CM | POA: Diagnosis not present

## 2022-12-30 DIAGNOSIS — N1832 Chronic kidney disease, stage 3b: Secondary | ICD-10-CM | POA: Diagnosis not present

## 2022-12-30 DIAGNOSIS — D3501 Benign neoplasm of right adrenal gland: Secondary | ICD-10-CM | POA: Diagnosis not present

## 2022-12-31 ENCOUNTER — Encounter (HOSPITAL_BASED_OUTPATIENT_CLINIC_OR_DEPARTMENT_OTHER): Payer: Medicare Other | Admitting: General Surgery

## 2023-01-01 DIAGNOSIS — Z299 Encounter for prophylactic measures, unspecified: Secondary | ICD-10-CM | POA: Diagnosis not present

## 2023-01-01 DIAGNOSIS — E039 Hypothyroidism, unspecified: Secondary | ICD-10-CM | POA: Diagnosis not present

## 2023-01-01 DIAGNOSIS — I10 Essential (primary) hypertension: Secondary | ICD-10-CM | POA: Diagnosis not present

## 2023-01-01 DIAGNOSIS — I4891 Unspecified atrial fibrillation: Secondary | ICD-10-CM | POA: Diagnosis not present

## 2023-01-01 DIAGNOSIS — N184 Chronic kidney disease, stage 4 (severe): Secondary | ICD-10-CM | POA: Diagnosis not present

## 2023-01-07 DIAGNOSIS — R8290 Unspecified abnormal findings in urine: Secondary | ICD-10-CM | POA: Diagnosis not present

## 2023-01-07 DIAGNOSIS — Z299 Encounter for prophylactic measures, unspecified: Secondary | ICD-10-CM | POA: Diagnosis not present

## 2023-01-07 DIAGNOSIS — I10 Essential (primary) hypertension: Secondary | ICD-10-CM | POA: Diagnosis not present

## 2023-01-07 DIAGNOSIS — L0390 Cellulitis, unspecified: Secondary | ICD-10-CM | POA: Diagnosis not present

## 2023-01-07 DIAGNOSIS — R5383 Other fatigue: Secondary | ICD-10-CM | POA: Diagnosis not present

## 2023-01-18 DIAGNOSIS — R010 Benign and innocent cardiac murmurs: Secondary | ICD-10-CM | POA: Diagnosis not present

## 2023-01-22 DIAGNOSIS — R600 Localized edema: Secondary | ICD-10-CM | POA: Diagnosis not present

## 2023-01-22 DIAGNOSIS — Z299 Encounter for prophylactic measures, unspecified: Secondary | ICD-10-CM | POA: Diagnosis not present

## 2023-01-22 DIAGNOSIS — I10 Essential (primary) hypertension: Secondary | ICD-10-CM | POA: Diagnosis not present

## 2023-01-22 DIAGNOSIS — I4892 Unspecified atrial flutter: Secondary | ICD-10-CM | POA: Diagnosis not present

## 2023-01-22 DIAGNOSIS — I4891 Unspecified atrial fibrillation: Secondary | ICD-10-CM | POA: Diagnosis not present

## 2023-02-05 DIAGNOSIS — I10 Essential (primary) hypertension: Secondary | ICD-10-CM | POA: Diagnosis not present

## 2023-02-05 DIAGNOSIS — Z23 Encounter for immunization: Secondary | ICD-10-CM | POA: Diagnosis not present

## 2023-02-05 DIAGNOSIS — L0390 Cellulitis, unspecified: Secondary | ICD-10-CM | POA: Diagnosis not present

## 2023-02-05 DIAGNOSIS — Z299 Encounter for prophylactic measures, unspecified: Secondary | ICD-10-CM | POA: Diagnosis not present

## 2023-02-05 DIAGNOSIS — N2581 Secondary hyperparathyroidism of renal origin: Secondary | ICD-10-CM | POA: Diagnosis not present

## 2023-02-05 DIAGNOSIS — R600 Localized edema: Secondary | ICD-10-CM | POA: Diagnosis not present

## 2023-02-05 DIAGNOSIS — H353290 Exudative age-related macular degeneration, unspecified eye, stage unspecified: Secondary | ICD-10-CM | POA: Diagnosis not present

## 2023-02-11 DIAGNOSIS — Z888 Allergy status to other drugs, medicaments and biological substances status: Secondary | ICD-10-CM | POA: Diagnosis not present

## 2023-02-11 DIAGNOSIS — L0390 Cellulitis, unspecified: Secondary | ICD-10-CM | POA: Diagnosis not present

## 2023-02-11 DIAGNOSIS — R54 Age-related physical debility: Secondary | ICD-10-CM | POA: Diagnosis present

## 2023-02-11 DIAGNOSIS — E785 Hyperlipidemia, unspecified: Secondary | ICD-10-CM | POA: Diagnosis present

## 2023-02-11 DIAGNOSIS — N189 Chronic kidney disease, unspecified: Secondary | ICD-10-CM | POA: Diagnosis not present

## 2023-02-11 DIAGNOSIS — Z886 Allergy status to analgesic agent status: Secondary | ICD-10-CM | POA: Diagnosis not present

## 2023-02-11 DIAGNOSIS — I10 Essential (primary) hypertension: Secondary | ICD-10-CM | POA: Diagnosis not present

## 2023-02-11 DIAGNOSIS — M79622 Pain in left upper arm: Secondary | ICD-10-CM | POA: Diagnosis not present

## 2023-02-11 DIAGNOSIS — E878 Other disorders of electrolyte and fluid balance, not elsewhere classified: Secondary | ICD-10-CM | POA: Diagnosis not present

## 2023-02-11 DIAGNOSIS — L03116 Cellulitis of left lower limb: Secondary | ICD-10-CM | POA: Diagnosis not present

## 2023-02-11 DIAGNOSIS — L03115 Cellulitis of right lower limb: Secondary | ICD-10-CM | POA: Diagnosis not present

## 2023-02-11 DIAGNOSIS — Z792 Long term (current) use of antibiotics: Secondary | ICD-10-CM | POA: Diagnosis not present

## 2023-02-11 DIAGNOSIS — Z1152 Encounter for screening for COVID-19: Secondary | ICD-10-CM | POA: Diagnosis not present

## 2023-02-11 DIAGNOSIS — M810 Age-related osteoporosis without current pathological fracture: Secondary | ICD-10-CM | POA: Diagnosis present

## 2023-02-11 DIAGNOSIS — R4182 Altered mental status, unspecified: Secondary | ICD-10-CM | POA: Diagnosis not present

## 2023-02-11 DIAGNOSIS — M79632 Pain in left forearm: Secondary | ICD-10-CM | POA: Diagnosis not present

## 2023-02-11 DIAGNOSIS — I480 Paroxysmal atrial fibrillation: Secondary | ICD-10-CM | POA: Diagnosis not present

## 2023-02-11 DIAGNOSIS — I129 Hypertensive chronic kidney disease with stage 1 through stage 4 chronic kidney disease, or unspecified chronic kidney disease: Secondary | ICD-10-CM | POA: Diagnosis present

## 2023-02-11 DIAGNOSIS — W19XXXD Unspecified fall, subsequent encounter: Secondary | ICD-10-CM | POA: Diagnosis not present

## 2023-02-11 DIAGNOSIS — R400 Somnolence: Secondary | ICD-10-CM | POA: Diagnosis not present

## 2023-02-11 DIAGNOSIS — I4891 Unspecified atrial fibrillation: Secondary | ICD-10-CM | POA: Diagnosis not present

## 2023-02-11 DIAGNOSIS — N183 Chronic kidney disease, stage 3 unspecified: Secondary | ICD-10-CM | POA: Diagnosis present

## 2023-02-11 DIAGNOSIS — T148XXA Other injury of unspecified body region, initial encounter: Secondary | ICD-10-CM | POA: Diagnosis not present

## 2023-02-11 DIAGNOSIS — S51012A Laceration without foreign body of left elbow, initial encounter: Secondary | ICD-10-CM | POA: Diagnosis present

## 2023-02-11 DIAGNOSIS — Z7982 Long term (current) use of aspirin: Secondary | ICD-10-CM | POA: Diagnosis not present

## 2023-02-11 DIAGNOSIS — Z79890 Hormone replacement therapy (postmenopausal): Secondary | ICD-10-CM | POA: Diagnosis not present

## 2023-02-11 DIAGNOSIS — G9389 Other specified disorders of brain: Secondary | ICD-10-CM | POA: Diagnosis not present

## 2023-02-11 DIAGNOSIS — H9190 Unspecified hearing loss, unspecified ear: Secondary | ICD-10-CM | POA: Diagnosis present

## 2023-02-11 DIAGNOSIS — Z79899 Other long term (current) drug therapy: Secondary | ICD-10-CM | POA: Diagnosis not present

## 2023-02-11 DIAGNOSIS — S81809D Unspecified open wound, unspecified lower leg, subsequent encounter: Secondary | ICD-10-CM | POA: Diagnosis not present

## 2023-02-11 DIAGNOSIS — M79662 Pain in left lower leg: Secondary | ICD-10-CM | POA: Diagnosis not present

## 2023-02-11 DIAGNOSIS — L03114 Cellulitis of left upper limb: Secondary | ICD-10-CM | POA: Diagnosis present

## 2023-02-11 DIAGNOSIS — I739 Peripheral vascular disease, unspecified: Secondary | ICD-10-CM | POA: Diagnosis present

## 2023-02-12 DIAGNOSIS — M79662 Pain in left lower leg: Secondary | ICD-10-CM | POA: Diagnosis not present

## 2023-02-12 DIAGNOSIS — I129 Hypertensive chronic kidney disease with stage 1 through stage 4 chronic kidney disease, or unspecified chronic kidney disease: Secondary | ICD-10-CM | POA: Diagnosis present

## 2023-02-12 DIAGNOSIS — N189 Chronic kidney disease, unspecified: Secondary | ICD-10-CM | POA: Diagnosis not present

## 2023-02-12 DIAGNOSIS — M79632 Pain in left forearm: Secondary | ICD-10-CM | POA: Diagnosis not present

## 2023-02-12 DIAGNOSIS — E785 Hyperlipidemia, unspecified: Secondary | ICD-10-CM | POA: Diagnosis present

## 2023-02-12 DIAGNOSIS — M810 Age-related osteoporosis without current pathological fracture: Secondary | ICD-10-CM | POA: Diagnosis present

## 2023-02-12 DIAGNOSIS — W19XXXD Unspecified fall, subsequent encounter: Secondary | ICD-10-CM | POA: Diagnosis not present

## 2023-02-12 DIAGNOSIS — Z888 Allergy status to other drugs, medicaments and biological substances status: Secondary | ICD-10-CM | POA: Diagnosis not present

## 2023-02-12 DIAGNOSIS — L0390 Cellulitis, unspecified: Secondary | ICD-10-CM | POA: Diagnosis not present

## 2023-02-12 DIAGNOSIS — N183 Chronic kidney disease, stage 3 unspecified: Secondary | ICD-10-CM | POA: Diagnosis present

## 2023-02-12 DIAGNOSIS — E878 Other disorders of electrolyte and fluid balance, not elsewhere classified: Secondary | ICD-10-CM | POA: Diagnosis not present

## 2023-02-12 DIAGNOSIS — I10 Essential (primary) hypertension: Secondary | ICD-10-CM | POA: Diagnosis not present

## 2023-02-12 DIAGNOSIS — M79622 Pain in left upper arm: Secondary | ICD-10-CM | POA: Diagnosis not present

## 2023-02-12 DIAGNOSIS — L03115 Cellulitis of right lower limb: Secondary | ICD-10-CM | POA: Diagnosis not present

## 2023-02-12 DIAGNOSIS — S81809D Unspecified open wound, unspecified lower leg, subsequent encounter: Secondary | ICD-10-CM | POA: Diagnosis not present

## 2023-02-12 DIAGNOSIS — Z79890 Hormone replacement therapy (postmenopausal): Secondary | ICD-10-CM | POA: Diagnosis not present

## 2023-02-12 DIAGNOSIS — Z886 Allergy status to analgesic agent status: Secondary | ICD-10-CM | POA: Diagnosis not present

## 2023-02-12 DIAGNOSIS — I480 Paroxysmal atrial fibrillation: Secondary | ICD-10-CM | POA: Diagnosis present

## 2023-02-12 DIAGNOSIS — Z7982 Long term (current) use of aspirin: Secondary | ICD-10-CM | POA: Diagnosis not present

## 2023-02-12 DIAGNOSIS — L03114 Cellulitis of left upper limb: Secondary | ICD-10-CM | POA: Diagnosis present

## 2023-02-12 DIAGNOSIS — G9389 Other specified disorders of brain: Secondary | ICD-10-CM | POA: Diagnosis not present

## 2023-02-12 DIAGNOSIS — Z79899 Other long term (current) drug therapy: Secondary | ICD-10-CM | POA: Diagnosis not present

## 2023-02-12 DIAGNOSIS — R400 Somnolence: Secondary | ICD-10-CM | POA: Diagnosis not present

## 2023-02-12 DIAGNOSIS — Z792 Long term (current) use of antibiotics: Secondary | ICD-10-CM | POA: Diagnosis not present

## 2023-02-12 DIAGNOSIS — R54 Age-related physical debility: Secondary | ICD-10-CM | POA: Diagnosis present

## 2023-02-12 DIAGNOSIS — R4182 Altered mental status, unspecified: Secondary | ICD-10-CM | POA: Diagnosis not present

## 2023-02-12 DIAGNOSIS — L03116 Cellulitis of left lower limb: Secondary | ICD-10-CM | POA: Diagnosis present

## 2023-02-12 DIAGNOSIS — H9190 Unspecified hearing loss, unspecified ear: Secondary | ICD-10-CM | POA: Diagnosis present

## 2023-02-12 DIAGNOSIS — I739 Peripheral vascular disease, unspecified: Secondary | ICD-10-CM | POA: Diagnosis present

## 2023-02-12 DIAGNOSIS — Z1152 Encounter for screening for COVID-19: Secondary | ICD-10-CM | POA: Diagnosis not present

## 2023-02-12 DIAGNOSIS — I4891 Unspecified atrial fibrillation: Secondary | ICD-10-CM | POA: Diagnosis not present

## 2023-02-12 DIAGNOSIS — S51012A Laceration without foreign body of left elbow, initial encounter: Secondary | ICD-10-CM | POA: Diagnosis present

## 2023-02-13 DIAGNOSIS — I129 Hypertensive chronic kidney disease with stage 1 through stage 4 chronic kidney disease, or unspecified chronic kidney disease: Secondary | ICD-10-CM | POA: Diagnosis not present

## 2023-02-13 DIAGNOSIS — S81809D Unspecified open wound, unspecified lower leg, subsequent encounter: Secondary | ICD-10-CM | POA: Diagnosis not present

## 2023-02-13 DIAGNOSIS — N189 Chronic kidney disease, unspecified: Secondary | ICD-10-CM | POA: Diagnosis not present

## 2023-02-13 DIAGNOSIS — I480 Paroxysmal atrial fibrillation: Secondary | ICD-10-CM | POA: Diagnosis not present

## 2023-02-13 DIAGNOSIS — Z79899 Other long term (current) drug therapy: Secondary | ICD-10-CM | POA: Diagnosis not present

## 2023-02-13 DIAGNOSIS — L0390 Cellulitis, unspecified: Secondary | ICD-10-CM | POA: Diagnosis not present

## 2023-02-13 DIAGNOSIS — W19XXXD Unspecified fall, subsequent encounter: Secondary | ICD-10-CM | POA: Diagnosis not present

## 2023-02-14 DIAGNOSIS — N189 Chronic kidney disease, unspecified: Secondary | ICD-10-CM | POA: Diagnosis not present

## 2023-02-14 DIAGNOSIS — L0390 Cellulitis, unspecified: Secondary | ICD-10-CM | POA: Diagnosis not present

## 2023-02-14 DIAGNOSIS — I480 Paroxysmal atrial fibrillation: Secondary | ICD-10-CM | POA: Diagnosis not present

## 2023-02-14 DIAGNOSIS — Z79899 Other long term (current) drug therapy: Secondary | ICD-10-CM | POA: Diagnosis not present

## 2023-02-14 DIAGNOSIS — I129 Hypertensive chronic kidney disease with stage 1 through stage 4 chronic kidney disease, or unspecified chronic kidney disease: Secondary | ICD-10-CM | POA: Diagnosis not present

## 2023-02-14 DIAGNOSIS — W19XXXD Unspecified fall, subsequent encounter: Secondary | ICD-10-CM | POA: Diagnosis not present

## 2023-02-14 DIAGNOSIS — S81809D Unspecified open wound, unspecified lower leg, subsequent encounter: Secondary | ICD-10-CM | POA: Diagnosis not present

## 2023-02-14 DIAGNOSIS — E878 Other disorders of electrolyte and fluid balance, not elsewhere classified: Secondary | ICD-10-CM | POA: Diagnosis not present

## 2023-02-15 DIAGNOSIS — G9389 Other specified disorders of brain: Secondary | ICD-10-CM | POA: Diagnosis not present

## 2023-02-15 DIAGNOSIS — R4182 Altered mental status, unspecified: Secondary | ICD-10-CM | POA: Diagnosis not present

## 2023-02-18 DIAGNOSIS — E785 Hyperlipidemia, unspecified: Secondary | ICD-10-CM | POA: Diagnosis not present

## 2023-02-18 DIAGNOSIS — Z7982 Long term (current) use of aspirin: Secondary | ICD-10-CM | POA: Diagnosis not present

## 2023-02-18 DIAGNOSIS — L03114 Cellulitis of left upper limb: Secondary | ICD-10-CM | POA: Diagnosis not present

## 2023-02-18 DIAGNOSIS — H3530 Unspecified macular degeneration: Secondary | ICD-10-CM | POA: Diagnosis not present

## 2023-02-18 DIAGNOSIS — L03116 Cellulitis of left lower limb: Secondary | ICD-10-CM | POA: Diagnosis not present

## 2023-02-18 DIAGNOSIS — E079 Disorder of thyroid, unspecified: Secondary | ICD-10-CM | POA: Diagnosis not present

## 2023-02-18 DIAGNOSIS — N183 Chronic kidney disease, stage 3 unspecified: Secondary | ICD-10-CM | POA: Diagnosis not present

## 2023-02-18 DIAGNOSIS — I129 Hypertensive chronic kidney disease with stage 1 through stage 4 chronic kidney disease, or unspecified chronic kidney disease: Secondary | ICD-10-CM | POA: Diagnosis not present

## 2023-02-18 DIAGNOSIS — I4891 Unspecified atrial fibrillation: Secondary | ICD-10-CM | POA: Diagnosis not present

## 2023-02-22 DIAGNOSIS — L03114 Cellulitis of left upper limb: Secondary | ICD-10-CM | POA: Diagnosis not present

## 2023-02-22 DIAGNOSIS — L03116 Cellulitis of left lower limb: Secondary | ICD-10-CM | POA: Diagnosis not present

## 2023-02-22 DIAGNOSIS — N183 Chronic kidney disease, stage 3 unspecified: Secondary | ICD-10-CM | POA: Diagnosis not present

## 2023-02-22 DIAGNOSIS — H3530 Unspecified macular degeneration: Secondary | ICD-10-CM | POA: Diagnosis not present

## 2023-02-22 DIAGNOSIS — I129 Hypertensive chronic kidney disease with stage 1 through stage 4 chronic kidney disease, or unspecified chronic kidney disease: Secondary | ICD-10-CM | POA: Diagnosis not present

## 2023-02-22 DIAGNOSIS — I4891 Unspecified atrial fibrillation: Secondary | ICD-10-CM | POA: Diagnosis not present

## 2023-02-23 DIAGNOSIS — H353114 Nonexudative age-related macular degeneration, right eye, advanced atrophic with subfoveal involvement: Secondary | ICD-10-CM | POA: Diagnosis not present

## 2023-02-23 DIAGNOSIS — H353123 Nonexudative age-related macular degeneration, left eye, advanced atrophic without subfoveal involvement: Secondary | ICD-10-CM | POA: Diagnosis not present

## 2023-02-23 DIAGNOSIS — H353211 Exudative age-related macular degeneration, right eye, with active choroidal neovascularization: Secondary | ICD-10-CM | POA: Diagnosis not present

## 2023-02-23 DIAGNOSIS — H43813 Vitreous degeneration, bilateral: Secondary | ICD-10-CM | POA: Diagnosis not present

## 2023-02-24 DIAGNOSIS — H3530 Unspecified macular degeneration: Secondary | ICD-10-CM | POA: Diagnosis not present

## 2023-02-24 DIAGNOSIS — I129 Hypertensive chronic kidney disease with stage 1 through stage 4 chronic kidney disease, or unspecified chronic kidney disease: Secondary | ICD-10-CM | POA: Diagnosis not present

## 2023-02-24 DIAGNOSIS — N183 Chronic kidney disease, stage 3 unspecified: Secondary | ICD-10-CM | POA: Diagnosis not present

## 2023-02-24 DIAGNOSIS — I4891 Unspecified atrial fibrillation: Secondary | ICD-10-CM | POA: Diagnosis not present

## 2023-02-24 DIAGNOSIS — L03116 Cellulitis of left lower limb: Secondary | ICD-10-CM | POA: Diagnosis not present

## 2023-02-24 DIAGNOSIS — L03114 Cellulitis of left upper limb: Secondary | ICD-10-CM | POA: Diagnosis not present

## 2023-02-25 ENCOUNTER — Ambulatory Visit: Payer: Medicare Other | Admitting: Internal Medicine

## 2023-02-26 DIAGNOSIS — E7800 Pure hypercholesterolemia, unspecified: Secondary | ICD-10-CM | POA: Diagnosis not present

## 2023-02-26 DIAGNOSIS — I129 Hypertensive chronic kidney disease with stage 1 through stage 4 chronic kidney disease, or unspecified chronic kidney disease: Secondary | ICD-10-CM | POA: Diagnosis not present

## 2023-02-26 DIAGNOSIS — I700 Atherosclerosis of aorta: Secondary | ICD-10-CM | POA: Diagnosis not present

## 2023-02-26 DIAGNOSIS — N183 Chronic kidney disease, stage 3 unspecified: Secondary | ICD-10-CM | POA: Diagnosis not present

## 2023-02-26 DIAGNOSIS — Z299 Encounter for prophylactic measures, unspecified: Secondary | ICD-10-CM | POA: Diagnosis not present

## 2023-02-26 DIAGNOSIS — R52 Pain, unspecified: Secondary | ICD-10-CM | POA: Diagnosis not present

## 2023-02-26 DIAGNOSIS — N184 Chronic kidney disease, stage 4 (severe): Secondary | ICD-10-CM | POA: Diagnosis not present

## 2023-02-26 DIAGNOSIS — I10 Essential (primary) hypertension: Secondary | ICD-10-CM | POA: Diagnosis not present

## 2023-02-26 DIAGNOSIS — I4891 Unspecified atrial fibrillation: Secondary | ICD-10-CM | POA: Diagnosis not present

## 2023-02-26 DIAGNOSIS — L0390 Cellulitis, unspecified: Secondary | ICD-10-CM | POA: Diagnosis not present

## 2023-02-26 DIAGNOSIS — L03116 Cellulitis of left lower limb: Secondary | ICD-10-CM | POA: Diagnosis not present

## 2023-02-26 DIAGNOSIS — H3530 Unspecified macular degeneration: Secondary | ICD-10-CM | POA: Diagnosis not present

## 2023-02-26 DIAGNOSIS — L03114 Cellulitis of left upper limb: Secondary | ICD-10-CM | POA: Diagnosis not present

## 2023-03-03 DIAGNOSIS — I4891 Unspecified atrial fibrillation: Secondary | ICD-10-CM | POA: Diagnosis not present

## 2023-03-03 DIAGNOSIS — I129 Hypertensive chronic kidney disease with stage 1 through stage 4 chronic kidney disease, or unspecified chronic kidney disease: Secondary | ICD-10-CM | POA: Diagnosis not present

## 2023-03-03 DIAGNOSIS — L03116 Cellulitis of left lower limb: Secondary | ICD-10-CM | POA: Diagnosis not present

## 2023-03-03 DIAGNOSIS — N183 Chronic kidney disease, stage 3 unspecified: Secondary | ICD-10-CM | POA: Diagnosis not present

## 2023-03-03 DIAGNOSIS — L03114 Cellulitis of left upper limb: Secondary | ICD-10-CM | POA: Diagnosis not present

## 2023-03-03 DIAGNOSIS — H3530 Unspecified macular degeneration: Secondary | ICD-10-CM | POA: Diagnosis not present

## 2023-03-05 DIAGNOSIS — I129 Hypertensive chronic kidney disease with stage 1 through stage 4 chronic kidney disease, or unspecified chronic kidney disease: Secondary | ICD-10-CM | POA: Diagnosis not present

## 2023-03-05 DIAGNOSIS — I4891 Unspecified atrial fibrillation: Secondary | ICD-10-CM | POA: Diagnosis not present

## 2023-03-05 DIAGNOSIS — L03116 Cellulitis of left lower limb: Secondary | ICD-10-CM | POA: Diagnosis not present

## 2023-03-05 DIAGNOSIS — N183 Chronic kidney disease, stage 3 unspecified: Secondary | ICD-10-CM | POA: Diagnosis not present

## 2023-03-05 DIAGNOSIS — L03114 Cellulitis of left upper limb: Secondary | ICD-10-CM | POA: Diagnosis not present

## 2023-03-05 DIAGNOSIS — H3530 Unspecified macular degeneration: Secondary | ICD-10-CM | POA: Diagnosis not present

## 2023-03-08 DIAGNOSIS — I4891 Unspecified atrial fibrillation: Secondary | ICD-10-CM | POA: Diagnosis not present

## 2023-03-08 DIAGNOSIS — H3530 Unspecified macular degeneration: Secondary | ICD-10-CM | POA: Diagnosis not present

## 2023-03-08 DIAGNOSIS — L03114 Cellulitis of left upper limb: Secondary | ICD-10-CM | POA: Diagnosis not present

## 2023-03-08 DIAGNOSIS — L03116 Cellulitis of left lower limb: Secondary | ICD-10-CM | POA: Diagnosis not present

## 2023-03-08 DIAGNOSIS — I129 Hypertensive chronic kidney disease with stage 1 through stage 4 chronic kidney disease, or unspecified chronic kidney disease: Secondary | ICD-10-CM | POA: Diagnosis not present

## 2023-03-08 DIAGNOSIS — N183 Chronic kidney disease, stage 3 unspecified: Secondary | ICD-10-CM | POA: Diagnosis not present

## 2023-03-10 NOTE — Progress Notes (Signed)
Updated medications and medical hx

## 2023-03-11 DIAGNOSIS — N183 Chronic kidney disease, stage 3 unspecified: Secondary | ICD-10-CM | POA: Diagnosis not present

## 2023-03-11 DIAGNOSIS — I4891 Unspecified atrial fibrillation: Secondary | ICD-10-CM | POA: Diagnosis not present

## 2023-03-11 DIAGNOSIS — H3530 Unspecified macular degeneration: Secondary | ICD-10-CM | POA: Diagnosis not present

## 2023-03-11 DIAGNOSIS — L03114 Cellulitis of left upper limb: Secondary | ICD-10-CM | POA: Diagnosis not present

## 2023-03-11 DIAGNOSIS — L03116 Cellulitis of left lower limb: Secondary | ICD-10-CM | POA: Diagnosis not present

## 2023-03-11 DIAGNOSIS — I129 Hypertensive chronic kidney disease with stage 1 through stage 4 chronic kidney disease, or unspecified chronic kidney disease: Secondary | ICD-10-CM | POA: Diagnosis not present

## 2023-03-12 DIAGNOSIS — Z1331 Encounter for screening for depression: Secondary | ICD-10-CM | POA: Diagnosis not present

## 2023-03-12 DIAGNOSIS — E559 Vitamin D deficiency, unspecified: Secondary | ICD-10-CM | POA: Diagnosis not present

## 2023-03-12 DIAGNOSIS — E039 Hypothyroidism, unspecified: Secondary | ICD-10-CM | POA: Diagnosis not present

## 2023-03-12 DIAGNOSIS — I10 Essential (primary) hypertension: Secondary | ICD-10-CM | POA: Diagnosis not present

## 2023-03-12 DIAGNOSIS — N184 Chronic kidney disease, stage 4 (severe): Secondary | ICD-10-CM | POA: Diagnosis not present

## 2023-03-12 DIAGNOSIS — Z7189 Other specified counseling: Secondary | ICD-10-CM | POA: Diagnosis not present

## 2023-03-12 DIAGNOSIS — E7800 Pure hypercholesterolemia, unspecified: Secondary | ICD-10-CM | POA: Diagnosis not present

## 2023-03-12 DIAGNOSIS — R5383 Other fatigue: Secondary | ICD-10-CM | POA: Diagnosis not present

## 2023-03-12 DIAGNOSIS — Z0000 Encounter for general adult medical examination without abnormal findings: Secondary | ICD-10-CM | POA: Diagnosis not present

## 2023-03-12 DIAGNOSIS — Z299 Encounter for prophylactic measures, unspecified: Secondary | ICD-10-CM | POA: Diagnosis not present

## 2023-03-12 DIAGNOSIS — Z1339 Encounter for screening examination for other mental health and behavioral disorders: Secondary | ICD-10-CM | POA: Diagnosis not present

## 2023-03-16 DIAGNOSIS — I4891 Unspecified atrial fibrillation: Secondary | ICD-10-CM | POA: Diagnosis not present

## 2023-03-16 DIAGNOSIS — I129 Hypertensive chronic kidney disease with stage 1 through stage 4 chronic kidney disease, or unspecified chronic kidney disease: Secondary | ICD-10-CM | POA: Diagnosis not present

## 2023-03-16 DIAGNOSIS — H3530 Unspecified macular degeneration: Secondary | ICD-10-CM | POA: Diagnosis not present

## 2023-03-16 DIAGNOSIS — N183 Chronic kidney disease, stage 3 unspecified: Secondary | ICD-10-CM | POA: Diagnosis not present

## 2023-03-16 DIAGNOSIS — L03114 Cellulitis of left upper limb: Secondary | ICD-10-CM | POA: Diagnosis not present

## 2023-03-16 DIAGNOSIS — L03116 Cellulitis of left lower limb: Secondary | ICD-10-CM | POA: Diagnosis not present

## 2023-03-18 ENCOUNTER — Encounter: Payer: Self-pay | Admitting: Internal Medicine

## 2023-03-18 ENCOUNTER — Ambulatory Visit: Payer: Medicare Other | Attending: Internal Medicine | Admitting: Internal Medicine

## 2023-03-18 VITALS — BP 179/110 | HR 83 | Ht 60.0 in | Wt 114.0 lb

## 2023-03-18 DIAGNOSIS — S0101XA Laceration without foreign body of scalp, initial encounter: Secondary | ICD-10-CM | POA: Diagnosis not present

## 2023-03-18 DIAGNOSIS — R519 Headache, unspecified: Secondary | ICD-10-CM | POA: Diagnosis not present

## 2023-03-18 DIAGNOSIS — I10 Essential (primary) hypertension: Secondary | ICD-10-CM | POA: Insufficient documentation

## 2023-03-18 DIAGNOSIS — S0990XA Unspecified injury of head, initial encounter: Secondary | ICD-10-CM | POA: Diagnosis not present

## 2023-03-18 DIAGNOSIS — Z7982 Long term (current) use of aspirin: Secondary | ICD-10-CM | POA: Diagnosis not present

## 2023-03-18 DIAGNOSIS — Z9181 History of falling: Secondary | ICD-10-CM | POA: Diagnosis not present

## 2023-03-18 DIAGNOSIS — S0181XA Laceration without foreign body of other part of head, initial encounter: Secondary | ICD-10-CM | POA: Diagnosis not present

## 2023-03-18 DIAGNOSIS — J320 Chronic maxillary sinusitis: Secondary | ICD-10-CM | POA: Diagnosis not present

## 2023-03-18 DIAGNOSIS — R58 Hemorrhage, not elsewhere classified: Secondary | ICD-10-CM | POA: Diagnosis not present

## 2023-03-18 DIAGNOSIS — S199XXA Unspecified injury of neck, initial encounter: Secondary | ICD-10-CM | POA: Diagnosis not present

## 2023-03-18 DIAGNOSIS — S0003XA Contusion of scalp, initial encounter: Secondary | ICD-10-CM | POA: Diagnosis not present

## 2023-03-18 DIAGNOSIS — E785 Hyperlipidemia, unspecified: Secondary | ICD-10-CM | POA: Diagnosis not present

## 2023-03-18 DIAGNOSIS — H532 Diplopia: Secondary | ICD-10-CM | POA: Insufficient documentation

## 2023-03-18 DIAGNOSIS — I129 Hypertensive chronic kidney disease with stage 1 through stage 4 chronic kidney disease, or unspecified chronic kidney disease: Secondary | ICD-10-CM | POA: Diagnosis not present

## 2023-03-18 DIAGNOSIS — N183 Chronic kidney disease, stage 3 unspecified: Secondary | ICD-10-CM | POA: Diagnosis not present

## 2023-03-18 DIAGNOSIS — I484 Atypical atrial flutter: Secondary | ICD-10-CM | POA: Insufficient documentation

## 2023-03-18 DIAGNOSIS — W01198A Fall on same level from slipping, tripping and stumbling with subsequent striking against other object, initial encounter: Secondary | ICD-10-CM | POA: Diagnosis not present

## 2023-03-18 DIAGNOSIS — W19XXXA Unspecified fall, initial encounter: Secondary | ICD-10-CM | POA: Diagnosis not present

## 2023-03-18 NOTE — Patient Instructions (Signed)

## 2023-03-18 NOTE — Progress Notes (Signed)
Cardiology Office Note  Date: 03/18/2023   ID: PATRECIA Cross, DOB 1919-11-07, MRN [id number]  PCP:  Kirstie Peri, MD  Cardiologist:  Marjo Bicker, MD Electrophysiologist:  None   History of Present Illness: Jeanette Cross is a 89 y.o. female known to have atypical atrial flutter, HTN was referred to cardiology clinic for further evaluation and management of atrial flutter.  EKG at the PCPs office showed atrial flutter and repeat EKG today in the clinic also showed atypical atrial flutter, rate controlled.  She denies having palpitations, fatigue, dizziness/lightheadedness.  She does report having SOB since early this year and has been worsening.  She is currently on p.o. Lasix 20 mg once daily.  Denies having any chest pains.  No prior CAD.  Past Medical History:  Diagnosis Date   A-fib (HCC)    Cancer (HCC)    Hypertension    Macular degeneration     Past Surgical History:  Procedure Laterality Date   CATARACT EXTRACTION W/PHACO Bilateral     Current Outpatient Medications  Medication Sig Dispense Refill   allopurinol (ZYLOPRIM) 100 MG tablet Take 100 mg by mouth daily.     aspirin EC 81 MG tablet Take 1 tablet by mouth daily.     furosemide (LASIX) 20 MG tablet Take 1 tablet by mouth daily.     levothyroxine (SYNTHROID) 50 MCG tablet Take 50 mcg by mouth daily.     lisinopril (ZESTRIL) 2.5 MG tablet Take 2.5 mg by mouth daily.     metoprolol succinate (TOPROL-XL) 25 MG 24 hr tablet Take 1 tablet by mouth daily.     Red Yeast Rice Extract 600 MG CAPS Take 1 capsule by mouth daily.     No current facility-administered medications for this visit.   Allergies:  Novocain [procaine] and Tylenol [acetaminophen]   Social History: The patient  reports that she has never smoked. She has never used smokeless tobacco. She reports that she does not currently use alcohol.   Family History: The patient's family history includes Hypertension in her sister.   ROS:   Please see the history of present illness. Otherwise, complete review of systems is positive for none  All other systems are reviewed and negative.   Physical Exam: VS:  BP (!) 179/110 (BP Location: Right Arm, Patient Position: Sitting, Cuff Size: Normal)   Pulse 83   Ht 5' (1.524 m)   Wt 114 lb (51.7 kg)   SpO2 99%   BMI 22.26 kg/m , BMI Body mass index is 22.26 kg/m.  Wt Readings from Last 3 Encounters:  03/18/23 114 lb (51.7 kg)  07/22/22 135 lb (61.2 kg)    General: Patient appears comfortable at rest. HEENT: Conjunctiva and lids normal, oropharynx clear with moist mucosa. Neck: Supple, no elevated JVP or carotid bruits, no thyromegaly. Lungs: Clear to auscultation, nonlabored breathing at rest. Cardiac: Regular rate and rhythm, systolic and diastolic murmur present. Abdomen: Soft, nontender, no hepatomegaly, bowel sounds present, no guarding or rebound. Extremities: No pitting edema, distal pulses 2+. Skin: Warm and dry. Musculoskeletal: No kyphosis. Neuropsychiatric: Alert and oriented x3, affect grossly appropriate.  Recent Labwork: No results found for requested labs within last 365 days.  No results found for: "CHOL", "TRIG", "HDL", "CHOLHDL", "VLDL", "LDLCALC", "LDLDIRECT"    Assessment and Plan:   Atypical atrial flutter, rate controlled HTN, partial controlled   -EKG showed atypical atrial flutter, rate controlled. No symptoms except for worsening SOB since early this year.  Currently  on p.o. Lasix 20 mg once daily.  Will continue metoprolol succinate 25 mg once daily for rate control.  She is not a candidate for systemic AC due to increased falls (from neuropathy and balance issues) and hence not a candidate for DCCV or ablation.  I would not recommend Watchman device due to her advanced age. Will defer echocardiogram as suspicion for tachycardia induced cardiomyopathy is very less due to controlled rates on the EKG today.  Although she did have systolic and  diastolic murmurs on physical examination, due to recurrent infections in her legs, she would not be a candidate for any valve replacement and hence okay to defer echocardiogram.  Recommend medical management.  Okay to continue aspirin 81 mg once daily.   I spent a total duration of 45 minutes during the prior notes, EKG, discussed about the pathophysiology of atrial flutter and management.  Reviewed the medications and documenting the findings in the note.      Medication Adjustments/Labs and Tests Ordered: Current medicines are reviewed at length with the patient today.  Concerns regarding medicines are outlined above.    Disposition:  Follow up  1 year  Signed Bethani Brugger Verne Spurr, MD, 03/18/2023 4:05 PM    [facility] [address] [place], [place], [place] [address]

## 2023-03-19 DIAGNOSIS — I129 Hypertensive chronic kidney disease with stage 1 through stage 4 chronic kidney disease, or unspecified chronic kidney disease: Secondary | ICD-10-CM | POA: Diagnosis not present

## 2023-03-19 DIAGNOSIS — L03116 Cellulitis of left lower limb: Secondary | ICD-10-CM | POA: Diagnosis not present

## 2023-03-19 DIAGNOSIS — I4891 Unspecified atrial fibrillation: Secondary | ICD-10-CM | POA: Diagnosis not present

## 2023-03-19 DIAGNOSIS — H3530 Unspecified macular degeneration: Secondary | ICD-10-CM | POA: Diagnosis not present

## 2023-03-19 DIAGNOSIS — N183 Chronic kidney disease, stage 3 unspecified: Secondary | ICD-10-CM | POA: Diagnosis not present

## 2023-03-19 DIAGNOSIS — L03114 Cellulitis of left upper limb: Secondary | ICD-10-CM | POA: Diagnosis not present

## 2023-03-20 DIAGNOSIS — I129 Hypertensive chronic kidney disease with stage 1 through stage 4 chronic kidney disease, or unspecified chronic kidney disease: Secondary | ICD-10-CM | POA: Diagnosis not present

## 2023-03-20 DIAGNOSIS — E785 Hyperlipidemia, unspecified: Secondary | ICD-10-CM | POA: Diagnosis not present

## 2023-03-20 DIAGNOSIS — L03116 Cellulitis of left lower limb: Secondary | ICD-10-CM | POA: Diagnosis not present

## 2023-03-20 DIAGNOSIS — L03114 Cellulitis of left upper limb: Secondary | ICD-10-CM | POA: Diagnosis not present

## 2023-03-20 DIAGNOSIS — N183 Chronic kidney disease, stage 3 unspecified: Secondary | ICD-10-CM | POA: Diagnosis not present

## 2023-03-20 DIAGNOSIS — Z7982 Long term (current) use of aspirin: Secondary | ICD-10-CM | POA: Diagnosis not present

## 2023-03-20 DIAGNOSIS — I4891 Unspecified atrial fibrillation: Secondary | ICD-10-CM | POA: Diagnosis not present

## 2023-03-20 DIAGNOSIS — H3530 Unspecified macular degeneration: Secondary | ICD-10-CM | POA: Diagnosis not present

## 2023-03-20 DIAGNOSIS — E079 Disorder of thyroid, unspecified: Secondary | ICD-10-CM | POA: Diagnosis not present

## 2023-03-22 DIAGNOSIS — L03114 Cellulitis of left upper limb: Secondary | ICD-10-CM | POA: Diagnosis not present

## 2023-03-22 DIAGNOSIS — H3530 Unspecified macular degeneration: Secondary | ICD-10-CM | POA: Diagnosis not present

## 2023-03-22 DIAGNOSIS — I129 Hypertensive chronic kidney disease with stage 1 through stage 4 chronic kidney disease, or unspecified chronic kidney disease: Secondary | ICD-10-CM | POA: Diagnosis not present

## 2023-03-22 DIAGNOSIS — N183 Chronic kidney disease, stage 3 unspecified: Secondary | ICD-10-CM | POA: Diagnosis not present

## 2023-03-22 DIAGNOSIS — L03116 Cellulitis of left lower limb: Secondary | ICD-10-CM | POA: Diagnosis not present

## 2023-03-22 DIAGNOSIS — I4891 Unspecified atrial fibrillation: Secondary | ICD-10-CM | POA: Diagnosis not present

## 2023-03-23 DIAGNOSIS — N1832 Chronic kidney disease, stage 3b: Secondary | ICD-10-CM | POA: Diagnosis not present

## 2023-03-23 DIAGNOSIS — R809 Proteinuria, unspecified: Secondary | ICD-10-CM | POA: Diagnosis not present

## 2023-03-24 DIAGNOSIS — N183 Chronic kidney disease, stage 3 unspecified: Secondary | ICD-10-CM | POA: Diagnosis not present

## 2023-03-24 DIAGNOSIS — L03114 Cellulitis of left upper limb: Secondary | ICD-10-CM | POA: Diagnosis not present

## 2023-03-24 DIAGNOSIS — I129 Hypertensive chronic kidney disease with stage 1 through stage 4 chronic kidney disease, or unspecified chronic kidney disease: Secondary | ICD-10-CM | POA: Diagnosis not present

## 2023-03-24 DIAGNOSIS — L03116 Cellulitis of left lower limb: Secondary | ICD-10-CM | POA: Diagnosis not present

## 2023-03-24 DIAGNOSIS — I4891 Unspecified atrial fibrillation: Secondary | ICD-10-CM | POA: Diagnosis not present

## 2023-03-24 DIAGNOSIS — H3530 Unspecified macular degeneration: Secondary | ICD-10-CM | POA: Diagnosis not present

## 2023-03-25 DIAGNOSIS — S0101XS Laceration without foreign body of scalp, sequela: Secondary | ICD-10-CM | POA: Diagnosis not present

## 2023-03-25 DIAGNOSIS — W19XXXA Unspecified fall, initial encounter: Secondary | ICD-10-CM | POA: Diagnosis not present

## 2023-03-25 DIAGNOSIS — I10 Essential (primary) hypertension: Secondary | ICD-10-CM | POA: Diagnosis not present

## 2023-03-25 DIAGNOSIS — Z299 Encounter for prophylactic measures, unspecified: Secondary | ICD-10-CM | POA: Diagnosis not present

## 2023-03-26 DIAGNOSIS — L03114 Cellulitis of left upper limb: Secondary | ICD-10-CM | POA: Diagnosis not present

## 2023-03-26 DIAGNOSIS — N183 Chronic kidney disease, stage 3 unspecified: Secondary | ICD-10-CM | POA: Diagnosis not present

## 2023-03-26 DIAGNOSIS — L03116 Cellulitis of left lower limb: Secondary | ICD-10-CM | POA: Diagnosis not present

## 2023-03-26 DIAGNOSIS — H3530 Unspecified macular degeneration: Secondary | ICD-10-CM | POA: Diagnosis not present

## 2023-03-26 DIAGNOSIS — I129 Hypertensive chronic kidney disease with stage 1 through stage 4 chronic kidney disease, or unspecified chronic kidney disease: Secondary | ICD-10-CM | POA: Diagnosis not present

## 2023-03-26 DIAGNOSIS — I4891 Unspecified atrial fibrillation: Secondary | ICD-10-CM | POA: Diagnosis not present

## 2023-03-29 DIAGNOSIS — N183 Chronic kidney disease, stage 3 unspecified: Secondary | ICD-10-CM | POA: Diagnosis not present

## 2023-03-29 DIAGNOSIS — L03114 Cellulitis of left upper limb: Secondary | ICD-10-CM | POA: Diagnosis not present

## 2023-03-29 DIAGNOSIS — I4891 Unspecified atrial fibrillation: Secondary | ICD-10-CM | POA: Diagnosis not present

## 2023-03-29 DIAGNOSIS — H3530 Unspecified macular degeneration: Secondary | ICD-10-CM | POA: Diagnosis not present

## 2023-03-29 DIAGNOSIS — L03116 Cellulitis of left lower limb: Secondary | ICD-10-CM | POA: Diagnosis not present

## 2023-03-29 DIAGNOSIS — I129 Hypertensive chronic kidney disease with stage 1 through stage 4 chronic kidney disease, or unspecified chronic kidney disease: Secondary | ICD-10-CM | POA: Diagnosis not present

## 2023-03-30 DIAGNOSIS — N2581 Secondary hyperparathyroidism of renal origin: Secondary | ICD-10-CM | POA: Diagnosis not present

## 2023-03-30 DIAGNOSIS — I129 Hypertensive chronic kidney disease with stage 1 through stage 4 chronic kidney disease, or unspecified chronic kidney disease: Secondary | ICD-10-CM | POA: Diagnosis not present

## 2023-03-30 DIAGNOSIS — N1832 Chronic kidney disease, stage 3b: Secondary | ICD-10-CM | POA: Diagnosis not present

## 2023-03-31 DIAGNOSIS — I4891 Unspecified atrial fibrillation: Secondary | ICD-10-CM | POA: Diagnosis not present

## 2023-03-31 DIAGNOSIS — I129 Hypertensive chronic kidney disease with stage 1 through stage 4 chronic kidney disease, or unspecified chronic kidney disease: Secondary | ICD-10-CM | POA: Diagnosis not present

## 2023-03-31 DIAGNOSIS — L03116 Cellulitis of left lower limb: Secondary | ICD-10-CM | POA: Diagnosis not present

## 2023-03-31 DIAGNOSIS — H3530 Unspecified macular degeneration: Secondary | ICD-10-CM | POA: Diagnosis not present

## 2023-03-31 DIAGNOSIS — L03114 Cellulitis of left upper limb: Secondary | ICD-10-CM | POA: Diagnosis not present

## 2023-03-31 DIAGNOSIS — N183 Chronic kidney disease, stage 3 unspecified: Secondary | ICD-10-CM | POA: Diagnosis not present

## 2023-04-02 DIAGNOSIS — L03114 Cellulitis of left upper limb: Secondary | ICD-10-CM | POA: Diagnosis not present

## 2023-04-02 DIAGNOSIS — L03116 Cellulitis of left lower limb: Secondary | ICD-10-CM | POA: Diagnosis not present

## 2023-04-02 DIAGNOSIS — H3530 Unspecified macular degeneration: Secondary | ICD-10-CM | POA: Diagnosis not present

## 2023-04-02 DIAGNOSIS — N183 Chronic kidney disease, stage 3 unspecified: Secondary | ICD-10-CM | POA: Diagnosis not present

## 2023-04-02 DIAGNOSIS — I129 Hypertensive chronic kidney disease with stage 1 through stage 4 chronic kidney disease, or unspecified chronic kidney disease: Secondary | ICD-10-CM | POA: Diagnosis not present

## 2023-04-02 DIAGNOSIS — I4891 Unspecified atrial fibrillation: Secondary | ICD-10-CM | POA: Diagnosis not present

## 2023-04-05 DIAGNOSIS — I4891 Unspecified atrial fibrillation: Secondary | ICD-10-CM | POA: Diagnosis not present

## 2023-04-05 DIAGNOSIS — N183 Chronic kidney disease, stage 3 unspecified: Secondary | ICD-10-CM | POA: Diagnosis not present

## 2023-04-05 DIAGNOSIS — L03116 Cellulitis of left lower limb: Secondary | ICD-10-CM | POA: Diagnosis not present

## 2023-04-05 DIAGNOSIS — L03114 Cellulitis of left upper limb: Secondary | ICD-10-CM | POA: Diagnosis not present

## 2023-04-05 DIAGNOSIS — H3530 Unspecified macular degeneration: Secondary | ICD-10-CM | POA: Diagnosis not present

## 2023-04-05 DIAGNOSIS — I129 Hypertensive chronic kidney disease with stage 1 through stage 4 chronic kidney disease, or unspecified chronic kidney disease: Secondary | ICD-10-CM | POA: Diagnosis not present

## 2023-04-07 DIAGNOSIS — N183 Chronic kidney disease, stage 3 unspecified: Secondary | ICD-10-CM | POA: Diagnosis not present

## 2023-04-07 DIAGNOSIS — H3530 Unspecified macular degeneration: Secondary | ICD-10-CM | POA: Diagnosis not present

## 2023-04-07 DIAGNOSIS — L03116 Cellulitis of left lower limb: Secondary | ICD-10-CM | POA: Diagnosis not present

## 2023-04-07 DIAGNOSIS — L03114 Cellulitis of left upper limb: Secondary | ICD-10-CM | POA: Diagnosis not present

## 2023-04-07 DIAGNOSIS — I129 Hypertensive chronic kidney disease with stage 1 through stage 4 chronic kidney disease, or unspecified chronic kidney disease: Secondary | ICD-10-CM | POA: Diagnosis not present

## 2023-04-07 DIAGNOSIS — I4891 Unspecified atrial fibrillation: Secondary | ICD-10-CM | POA: Diagnosis not present

## 2023-04-09 DIAGNOSIS — N183 Chronic kidney disease, stage 3 unspecified: Secondary | ICD-10-CM | POA: Diagnosis not present

## 2023-04-09 DIAGNOSIS — I4891 Unspecified atrial fibrillation: Secondary | ICD-10-CM | POA: Diagnosis not present

## 2023-04-09 DIAGNOSIS — I129 Hypertensive chronic kidney disease with stage 1 through stage 4 chronic kidney disease, or unspecified chronic kidney disease: Secondary | ICD-10-CM | POA: Diagnosis not present

## 2023-04-09 DIAGNOSIS — L03116 Cellulitis of left lower limb: Secondary | ICD-10-CM | POA: Diagnosis not present

## 2023-04-09 DIAGNOSIS — H3530 Unspecified macular degeneration: Secondary | ICD-10-CM | POA: Diagnosis not present

## 2023-04-09 DIAGNOSIS — L03114 Cellulitis of left upper limb: Secondary | ICD-10-CM | POA: Diagnosis not present

## 2023-04-12 DIAGNOSIS — N183 Chronic kidney disease, stage 3 unspecified: Secondary | ICD-10-CM | POA: Diagnosis not present

## 2023-04-12 DIAGNOSIS — I129 Hypertensive chronic kidney disease with stage 1 through stage 4 chronic kidney disease, or unspecified chronic kidney disease: Secondary | ICD-10-CM | POA: Diagnosis not present

## 2023-04-12 DIAGNOSIS — L03114 Cellulitis of left upper limb: Secondary | ICD-10-CM | POA: Diagnosis not present

## 2023-04-12 DIAGNOSIS — I4891 Unspecified atrial fibrillation: Secondary | ICD-10-CM | POA: Diagnosis not present

## 2023-04-12 DIAGNOSIS — L03116 Cellulitis of left lower limb: Secondary | ICD-10-CM | POA: Diagnosis not present

## 2023-04-12 DIAGNOSIS — H3530 Unspecified macular degeneration: Secondary | ICD-10-CM | POA: Diagnosis not present

## 2023-04-14 DIAGNOSIS — L03114 Cellulitis of left upper limb: Secondary | ICD-10-CM | POA: Diagnosis not present

## 2023-04-14 DIAGNOSIS — I129 Hypertensive chronic kidney disease with stage 1 through stage 4 chronic kidney disease, or unspecified chronic kidney disease: Secondary | ICD-10-CM | POA: Diagnosis not present

## 2023-04-14 DIAGNOSIS — N183 Chronic kidney disease, stage 3 unspecified: Secondary | ICD-10-CM | POA: Diagnosis not present

## 2023-04-14 DIAGNOSIS — L03116 Cellulitis of left lower limb: Secondary | ICD-10-CM | POA: Diagnosis not present

## 2023-04-14 DIAGNOSIS — H3530 Unspecified macular degeneration: Secondary | ICD-10-CM | POA: Diagnosis not present

## 2023-04-14 DIAGNOSIS — I4891 Unspecified atrial fibrillation: Secondary | ICD-10-CM | POA: Diagnosis not present

## 2023-04-16 DIAGNOSIS — L03114 Cellulitis of left upper limb: Secondary | ICD-10-CM | POA: Diagnosis not present

## 2023-04-16 DIAGNOSIS — L03116 Cellulitis of left lower limb: Secondary | ICD-10-CM | POA: Diagnosis not present

## 2023-04-16 DIAGNOSIS — H3530 Unspecified macular degeneration: Secondary | ICD-10-CM | POA: Diagnosis not present

## 2023-04-16 DIAGNOSIS — I129 Hypertensive chronic kidney disease with stage 1 through stage 4 chronic kidney disease, or unspecified chronic kidney disease: Secondary | ICD-10-CM | POA: Diagnosis not present

## 2023-04-16 DIAGNOSIS — N183 Chronic kidney disease, stage 3 unspecified: Secondary | ICD-10-CM | POA: Diagnosis not present

## 2023-04-16 DIAGNOSIS — I4891 Unspecified atrial fibrillation: Secondary | ICD-10-CM | POA: Diagnosis not present

## 2023-04-19 DIAGNOSIS — L03116 Cellulitis of left lower limb: Secondary | ICD-10-CM | POA: Diagnosis not present

## 2023-04-19 DIAGNOSIS — I4891 Unspecified atrial fibrillation: Secondary | ICD-10-CM | POA: Diagnosis not present

## 2023-04-19 DIAGNOSIS — Z7982 Long term (current) use of aspirin: Secondary | ICD-10-CM | POA: Diagnosis not present

## 2023-04-19 DIAGNOSIS — E079 Disorder of thyroid, unspecified: Secondary | ICD-10-CM | POA: Diagnosis not present

## 2023-04-19 DIAGNOSIS — N183 Chronic kidney disease, stage 3 unspecified: Secondary | ICD-10-CM | POA: Diagnosis not present

## 2023-04-19 DIAGNOSIS — H3530 Unspecified macular degeneration: Secondary | ICD-10-CM | POA: Diagnosis not present

## 2023-04-19 DIAGNOSIS — E785 Hyperlipidemia, unspecified: Secondary | ICD-10-CM | POA: Diagnosis not present

## 2023-04-19 DIAGNOSIS — I129 Hypertensive chronic kidney disease with stage 1 through stage 4 chronic kidney disease, or unspecified chronic kidney disease: Secondary | ICD-10-CM | POA: Diagnosis not present

## 2023-04-21 DIAGNOSIS — I129 Hypertensive chronic kidney disease with stage 1 through stage 4 chronic kidney disease, or unspecified chronic kidney disease: Secondary | ICD-10-CM | POA: Diagnosis not present

## 2023-04-21 DIAGNOSIS — N183 Chronic kidney disease, stage 3 unspecified: Secondary | ICD-10-CM | POA: Diagnosis not present

## 2023-04-21 DIAGNOSIS — L03116 Cellulitis of left lower limb: Secondary | ICD-10-CM | POA: Diagnosis not present

## 2023-04-21 DIAGNOSIS — E079 Disorder of thyroid, unspecified: Secondary | ICD-10-CM | POA: Diagnosis not present

## 2023-04-21 DIAGNOSIS — H3530 Unspecified macular degeneration: Secondary | ICD-10-CM | POA: Diagnosis not present

## 2023-04-21 DIAGNOSIS — I4891 Unspecified atrial fibrillation: Secondary | ICD-10-CM | POA: Diagnosis not present

## 2023-04-23 DIAGNOSIS — L03116 Cellulitis of left lower limb: Secondary | ICD-10-CM | POA: Diagnosis not present

## 2023-04-23 DIAGNOSIS — E079 Disorder of thyroid, unspecified: Secondary | ICD-10-CM | POA: Diagnosis not present

## 2023-04-23 DIAGNOSIS — I129 Hypertensive chronic kidney disease with stage 1 through stage 4 chronic kidney disease, or unspecified chronic kidney disease: Secondary | ICD-10-CM | POA: Diagnosis not present

## 2023-04-23 DIAGNOSIS — N183 Chronic kidney disease, stage 3 unspecified: Secondary | ICD-10-CM | POA: Diagnosis not present

## 2023-04-23 DIAGNOSIS — H3530 Unspecified macular degeneration: Secondary | ICD-10-CM | POA: Diagnosis not present

## 2023-04-23 DIAGNOSIS — I4891 Unspecified atrial fibrillation: Secondary | ICD-10-CM | POA: Diagnosis not present

## 2023-04-30 DIAGNOSIS — I129 Hypertensive chronic kidney disease with stage 1 through stage 4 chronic kidney disease, or unspecified chronic kidney disease: Secondary | ICD-10-CM | POA: Diagnosis not present

## 2023-04-30 DIAGNOSIS — I4891 Unspecified atrial fibrillation: Secondary | ICD-10-CM | POA: Diagnosis not present

## 2023-04-30 DIAGNOSIS — N183 Chronic kidney disease, stage 3 unspecified: Secondary | ICD-10-CM | POA: Diagnosis not present

## 2023-04-30 DIAGNOSIS — L03116 Cellulitis of left lower limb: Secondary | ICD-10-CM | POA: Diagnosis not present

## 2023-04-30 DIAGNOSIS — E079 Disorder of thyroid, unspecified: Secondary | ICD-10-CM | POA: Diagnosis not present

## 2023-04-30 DIAGNOSIS — H3530 Unspecified macular degeneration: Secondary | ICD-10-CM | POA: Diagnosis not present

## 2023-05-03 DIAGNOSIS — H3530 Unspecified macular degeneration: Secondary | ICD-10-CM | POA: Diagnosis not present

## 2023-05-03 DIAGNOSIS — E079 Disorder of thyroid, unspecified: Secondary | ICD-10-CM | POA: Diagnosis not present

## 2023-05-03 DIAGNOSIS — I129 Hypertensive chronic kidney disease with stage 1 through stage 4 chronic kidney disease, or unspecified chronic kidney disease: Secondary | ICD-10-CM | POA: Diagnosis not present

## 2023-05-03 DIAGNOSIS — L03116 Cellulitis of left lower limb: Secondary | ICD-10-CM | POA: Diagnosis not present

## 2023-05-03 DIAGNOSIS — I4891 Unspecified atrial fibrillation: Secondary | ICD-10-CM | POA: Diagnosis not present

## 2023-05-03 DIAGNOSIS — N183 Chronic kidney disease, stage 3 unspecified: Secondary | ICD-10-CM | POA: Diagnosis not present

## 2023-05-07 DIAGNOSIS — L03116 Cellulitis of left lower limb: Secondary | ICD-10-CM | POA: Diagnosis not present

## 2023-05-07 DIAGNOSIS — H3530 Unspecified macular degeneration: Secondary | ICD-10-CM | POA: Diagnosis not present

## 2023-05-07 DIAGNOSIS — N183 Chronic kidney disease, stage 3 unspecified: Secondary | ICD-10-CM | POA: Diagnosis not present

## 2023-05-07 DIAGNOSIS — I4891 Unspecified atrial fibrillation: Secondary | ICD-10-CM | POA: Diagnosis not present

## 2023-05-07 DIAGNOSIS — I129 Hypertensive chronic kidney disease with stage 1 through stage 4 chronic kidney disease, or unspecified chronic kidney disease: Secondary | ICD-10-CM | POA: Diagnosis not present

## 2023-05-07 DIAGNOSIS — E079 Disorder of thyroid, unspecified: Secondary | ICD-10-CM | POA: Diagnosis not present

## 2023-05-14 DIAGNOSIS — I4891 Unspecified atrial fibrillation: Secondary | ICD-10-CM | POA: Diagnosis not present

## 2023-05-14 DIAGNOSIS — I129 Hypertensive chronic kidney disease with stage 1 through stage 4 chronic kidney disease, or unspecified chronic kidney disease: Secondary | ICD-10-CM | POA: Diagnosis not present

## 2023-05-14 DIAGNOSIS — H3530 Unspecified macular degeneration: Secondary | ICD-10-CM | POA: Diagnosis not present

## 2023-05-14 DIAGNOSIS — L03116 Cellulitis of left lower limb: Secondary | ICD-10-CM | POA: Diagnosis not present

## 2023-05-14 DIAGNOSIS — E079 Disorder of thyroid, unspecified: Secondary | ICD-10-CM | POA: Diagnosis not present

## 2023-05-14 DIAGNOSIS — N183 Chronic kidney disease, stage 3 unspecified: Secondary | ICD-10-CM | POA: Diagnosis not present

## 2023-05-19 DIAGNOSIS — I129 Hypertensive chronic kidney disease with stage 1 through stage 4 chronic kidney disease, or unspecified chronic kidney disease: Secondary | ICD-10-CM | POA: Diagnosis not present

## 2023-05-19 DIAGNOSIS — N183 Chronic kidney disease, stage 3 unspecified: Secondary | ICD-10-CM | POA: Diagnosis not present

## 2023-05-19 DIAGNOSIS — I4891 Unspecified atrial fibrillation: Secondary | ICD-10-CM | POA: Diagnosis not present

## 2023-05-19 DIAGNOSIS — E785 Hyperlipidemia, unspecified: Secondary | ICD-10-CM | POA: Diagnosis not present

## 2023-05-19 DIAGNOSIS — L03116 Cellulitis of left lower limb: Secondary | ICD-10-CM | POA: Diagnosis not present

## 2023-05-19 DIAGNOSIS — H3530 Unspecified macular degeneration: Secondary | ICD-10-CM | POA: Diagnosis not present

## 2023-05-19 DIAGNOSIS — E079 Disorder of thyroid, unspecified: Secondary | ICD-10-CM | POA: Diagnosis not present

## 2023-05-19 DIAGNOSIS — Z7982 Long term (current) use of aspirin: Secondary | ICD-10-CM | POA: Diagnosis not present

## 2023-05-20 DIAGNOSIS — I4891 Unspecified atrial fibrillation: Secondary | ICD-10-CM | POA: Diagnosis not present

## 2023-05-20 DIAGNOSIS — N183 Chronic kidney disease, stage 3 unspecified: Secondary | ICD-10-CM | POA: Diagnosis not present

## 2023-05-20 DIAGNOSIS — L03116 Cellulitis of left lower limb: Secondary | ICD-10-CM | POA: Diagnosis not present

## 2023-05-20 DIAGNOSIS — I129 Hypertensive chronic kidney disease with stage 1 through stage 4 chronic kidney disease, or unspecified chronic kidney disease: Secondary | ICD-10-CM | POA: Diagnosis not present

## 2023-05-24 DIAGNOSIS — R350 Frequency of micturition: Secondary | ICD-10-CM | POA: Diagnosis not present

## 2023-05-24 DIAGNOSIS — N390 Urinary tract infection, site not specified: Secondary | ICD-10-CM | POA: Diagnosis not present

## 2023-05-24 DIAGNOSIS — R410 Disorientation, unspecified: Secondary | ICD-10-CM | POA: Diagnosis not present

## 2023-05-25 DIAGNOSIS — I129 Hypertensive chronic kidney disease with stage 1 through stage 4 chronic kidney disease, or unspecified chronic kidney disease: Secondary | ICD-10-CM | POA: Diagnosis not present

## 2023-05-25 DIAGNOSIS — E079 Disorder of thyroid, unspecified: Secondary | ICD-10-CM | POA: Diagnosis not present

## 2023-05-25 DIAGNOSIS — I4891 Unspecified atrial fibrillation: Secondary | ICD-10-CM | POA: Diagnosis not present

## 2023-05-25 DIAGNOSIS — N390 Urinary tract infection, site not specified: Secondary | ICD-10-CM | POA: Diagnosis not present

## 2023-05-25 DIAGNOSIS — L03116 Cellulitis of left lower limb: Secondary | ICD-10-CM | POA: Diagnosis not present

## 2023-05-25 DIAGNOSIS — H3530 Unspecified macular degeneration: Secondary | ICD-10-CM | POA: Diagnosis not present

## 2023-05-25 DIAGNOSIS — N183 Chronic kidney disease, stage 3 unspecified: Secondary | ICD-10-CM | POA: Diagnosis not present

## 2023-06-03 DIAGNOSIS — I4891 Unspecified atrial fibrillation: Secondary | ICD-10-CM | POA: Diagnosis not present

## 2023-06-03 DIAGNOSIS — E079 Disorder of thyroid, unspecified: Secondary | ICD-10-CM | POA: Diagnosis not present

## 2023-06-03 DIAGNOSIS — N183 Chronic kidney disease, stage 3 unspecified: Secondary | ICD-10-CM | POA: Diagnosis not present

## 2023-06-03 DIAGNOSIS — L03116 Cellulitis of left lower limb: Secondary | ICD-10-CM | POA: Diagnosis not present

## 2023-06-03 DIAGNOSIS — H3530 Unspecified macular degeneration: Secondary | ICD-10-CM | POA: Diagnosis not present

## 2023-06-03 DIAGNOSIS — I129 Hypertensive chronic kidney disease with stage 1 through stage 4 chronic kidney disease, or unspecified chronic kidney disease: Secondary | ICD-10-CM | POA: Diagnosis not present

## 2023-06-09 DIAGNOSIS — N183 Chronic kidney disease, stage 3 unspecified: Secondary | ICD-10-CM | POA: Diagnosis not present

## 2023-06-09 DIAGNOSIS — I129 Hypertensive chronic kidney disease with stage 1 through stage 4 chronic kidney disease, or unspecified chronic kidney disease: Secondary | ICD-10-CM | POA: Diagnosis not present

## 2023-06-09 DIAGNOSIS — E079 Disorder of thyroid, unspecified: Secondary | ICD-10-CM | POA: Diagnosis not present

## 2023-06-09 DIAGNOSIS — I4891 Unspecified atrial fibrillation: Secondary | ICD-10-CM | POA: Diagnosis not present

## 2023-06-09 DIAGNOSIS — H3530 Unspecified macular degeneration: Secondary | ICD-10-CM | POA: Diagnosis not present

## 2023-06-09 DIAGNOSIS — L03116 Cellulitis of left lower limb: Secondary | ICD-10-CM | POA: Diagnosis not present

## 2023-06-14 DIAGNOSIS — N184 Chronic kidney disease, stage 4 (severe): Secondary | ICD-10-CM | POA: Diagnosis not present

## 2023-06-14 DIAGNOSIS — Z299 Encounter for prophylactic measures, unspecified: Secondary | ICD-10-CM | POA: Diagnosis not present

## 2023-06-14 DIAGNOSIS — N2581 Secondary hyperparathyroidism of renal origin: Secondary | ICD-10-CM | POA: Diagnosis not present

## 2023-06-14 DIAGNOSIS — I4891 Unspecified atrial fibrillation: Secondary | ICD-10-CM | POA: Diagnosis not present

## 2023-06-14 DIAGNOSIS — I4892 Unspecified atrial flutter: Secondary | ICD-10-CM | POA: Diagnosis not present

## 2023-06-14 DIAGNOSIS — I10 Essential (primary) hypertension: Secondary | ICD-10-CM | POA: Diagnosis not present

## 2023-06-17 DIAGNOSIS — N183 Chronic kidney disease, stage 3 unspecified: Secondary | ICD-10-CM | POA: Diagnosis not present

## 2023-06-17 DIAGNOSIS — H3530 Unspecified macular degeneration: Secondary | ICD-10-CM | POA: Diagnosis not present

## 2023-06-17 DIAGNOSIS — I129 Hypertensive chronic kidney disease with stage 1 through stage 4 chronic kidney disease, or unspecified chronic kidney disease: Secondary | ICD-10-CM | POA: Diagnosis not present

## 2023-06-17 DIAGNOSIS — E079 Disorder of thyroid, unspecified: Secondary | ICD-10-CM | POA: Diagnosis not present

## 2023-06-17 DIAGNOSIS — L03116 Cellulitis of left lower limb: Secondary | ICD-10-CM | POA: Diagnosis not present

## 2023-06-17 DIAGNOSIS — I4891 Unspecified atrial fibrillation: Secondary | ICD-10-CM | POA: Diagnosis not present

## 2023-07-01 DIAGNOSIS — H353114 Nonexudative age-related macular degeneration, right eye, advanced atrophic with subfoveal involvement: Secondary | ICD-10-CM | POA: Diagnosis not present

## 2023-07-01 DIAGNOSIS — H353211 Exudative age-related macular degeneration, right eye, with active choroidal neovascularization: Secondary | ICD-10-CM | POA: Diagnosis not present

## 2023-07-01 DIAGNOSIS — H43813 Vitreous degeneration, bilateral: Secondary | ICD-10-CM | POA: Diagnosis not present

## 2023-07-01 DIAGNOSIS — H353123 Nonexudative age-related macular degeneration, left eye, advanced atrophic without subfoveal involvement: Secondary | ICD-10-CM | POA: Diagnosis not present

## 2023-07-12 DIAGNOSIS — F0390 Unspecified dementia without behavioral disturbance: Secondary | ICD-10-CM | POA: Diagnosis not present

## 2023-07-12 DIAGNOSIS — I10 Essential (primary) hypertension: Secondary | ICD-10-CM | POA: Diagnosis not present

## 2023-07-12 DIAGNOSIS — Z299 Encounter for prophylactic measures, unspecified: Secondary | ICD-10-CM | POA: Diagnosis not present

## 2023-07-12 DIAGNOSIS — S32010A Wedge compression fracture of first lumbar vertebra, initial encounter for closed fracture: Secondary | ICD-10-CM | POA: Diagnosis not present

## 2023-07-12 DIAGNOSIS — I4891 Unspecified atrial fibrillation: Secondary | ICD-10-CM | POA: Diagnosis not present

## 2023-07-14 DIAGNOSIS — R350 Frequency of micturition: Secondary | ICD-10-CM | POA: Diagnosis not present

## 2023-09-08 DIAGNOSIS — R809 Proteinuria, unspecified: Secondary | ICD-10-CM | POA: Diagnosis not present

## 2023-09-08 DIAGNOSIS — N189 Chronic kidney disease, unspecified: Secondary | ICD-10-CM | POA: Diagnosis not present

## 2023-09-08 DIAGNOSIS — D631 Anemia in chronic kidney disease: Secondary | ICD-10-CM | POA: Diagnosis not present

## 2023-09-15 DIAGNOSIS — D3501 Benign neoplasm of right adrenal gland: Secondary | ICD-10-CM | POA: Diagnosis not present

## 2023-09-15 DIAGNOSIS — I129 Hypertensive chronic kidney disease with stage 1 through stage 4 chronic kidney disease, or unspecified chronic kidney disease: Secondary | ICD-10-CM | POA: Diagnosis not present

## 2023-09-15 DIAGNOSIS — N1832 Chronic kidney disease, stage 3b: Secondary | ICD-10-CM | POA: Diagnosis not present

## 2023-09-28 DIAGNOSIS — H02831 Dermatochalasis of right upper eyelid: Secondary | ICD-10-CM | POA: Diagnosis not present

## 2023-09-28 DIAGNOSIS — H04123 Dry eye syndrome of bilateral lacrimal glands: Secondary | ICD-10-CM | POA: Diagnosis not present

## 2023-09-28 DIAGNOSIS — H353211 Exudative age-related macular degeneration, right eye, with active choroidal neovascularization: Secondary | ICD-10-CM | POA: Diagnosis not present

## 2023-09-28 DIAGNOSIS — H353124 Nonexudative age-related macular degeneration, left eye, advanced atrophic with subfoveal involvement: Secondary | ICD-10-CM | POA: Diagnosis not present

## 2023-11-02 DIAGNOSIS — H353114 Nonexudative age-related macular degeneration, right eye, advanced atrophic with subfoveal involvement: Secondary | ICD-10-CM | POA: Diagnosis not present

## 2023-11-02 DIAGNOSIS — H43813 Vitreous degeneration, bilateral: Secondary | ICD-10-CM | POA: Diagnosis not present

## 2023-11-02 DIAGNOSIS — H353211 Exudative age-related macular degeneration, right eye, with active choroidal neovascularization: Secondary | ICD-10-CM | POA: Diagnosis not present

## 2023-11-02 DIAGNOSIS — H353123 Nonexudative age-related macular degeneration, left eye, advanced atrophic without subfoveal involvement: Secondary | ICD-10-CM | POA: Diagnosis not present

## 2023-11-17 DIAGNOSIS — H04123 Dry eye syndrome of bilateral lacrimal glands: Secondary | ICD-10-CM | POA: Diagnosis not present

## 2023-12-14 DIAGNOSIS — I4891 Unspecified atrial fibrillation: Secondary | ICD-10-CM | POA: Diagnosis not present

## 2023-12-14 DIAGNOSIS — Z299 Encounter for prophylactic measures, unspecified: Secondary | ICD-10-CM | POA: Diagnosis not present

## 2023-12-14 DIAGNOSIS — I10 Essential (primary) hypertension: Secondary | ICD-10-CM | POA: Diagnosis not present

## 2024-01-26 DIAGNOSIS — H16233 Neurotrophic keratoconjunctivitis, bilateral: Secondary | ICD-10-CM | POA: Diagnosis not present

## 2024-01-26 DIAGNOSIS — H04123 Dry eye syndrome of bilateral lacrimal glands: Secondary | ICD-10-CM | POA: Diagnosis not present

## 2024-03-01 DIAGNOSIS — D631 Anemia in chronic kidney disease: Secondary | ICD-10-CM | POA: Diagnosis not present

## 2024-03-01 DIAGNOSIS — R809 Proteinuria, unspecified: Secondary | ICD-10-CM | POA: Diagnosis not present

## 2024-03-01 DIAGNOSIS — N189 Chronic kidney disease, unspecified: Secondary | ICD-10-CM | POA: Diagnosis not present

## 2024-03-01 DIAGNOSIS — E211 Secondary hyperparathyroidism, not elsewhere classified: Secondary | ICD-10-CM | POA: Diagnosis not present

## 2024-03-02 DIAGNOSIS — H353123 Nonexudative age-related macular degeneration, left eye, advanced atrophic without subfoveal involvement: Secondary | ICD-10-CM | POA: Diagnosis not present

## 2024-03-02 DIAGNOSIS — H353114 Nonexudative age-related macular degeneration, right eye, advanced atrophic with subfoveal involvement: Secondary | ICD-10-CM | POA: Diagnosis not present

## 2024-03-02 DIAGNOSIS — H43813 Vitreous degeneration, bilateral: Secondary | ICD-10-CM | POA: Diagnosis not present

## 2024-03-02 DIAGNOSIS — H353211 Exudative age-related macular degeneration, right eye, with active choroidal neovascularization: Secondary | ICD-10-CM | POA: Diagnosis not present

## 2024-03-15 DIAGNOSIS — N1832 Chronic kidney disease, stage 3b: Secondary | ICD-10-CM | POA: Diagnosis not present

## 2024-03-15 DIAGNOSIS — I129 Hypertensive chronic kidney disease with stage 1 through stage 4 chronic kidney disease, or unspecified chronic kidney disease: Secondary | ICD-10-CM | POA: Diagnosis not present

## 2024-03-15 DIAGNOSIS — R809 Proteinuria, unspecified: Secondary | ICD-10-CM | POA: Diagnosis not present

## 2024-03-15 DIAGNOSIS — D3501 Benign neoplasm of right adrenal gland: Secondary | ICD-10-CM | POA: Diagnosis not present

## 2024-03-22 DIAGNOSIS — I10 Essential (primary) hypertension: Secondary | ICD-10-CM | POA: Diagnosis not present

## 2024-03-22 DIAGNOSIS — Z7189 Other specified counseling: Secondary | ICD-10-CM | POA: Diagnosis not present

## 2024-03-22 DIAGNOSIS — R5383 Other fatigue: Secondary | ICD-10-CM | POA: Diagnosis not present

## 2024-03-22 DIAGNOSIS — M7989 Other specified soft tissue disorders: Secondary | ICD-10-CM | POA: Diagnosis not present

## 2024-03-22 DIAGNOSIS — Z0000 Encounter for general adult medical examination without abnormal findings: Secondary | ICD-10-CM | POA: Diagnosis not present

## 2024-03-22 DIAGNOSIS — Z299 Encounter for prophylactic measures, unspecified: Secondary | ICD-10-CM | POA: Diagnosis not present

## 2024-03-22 DIAGNOSIS — Z1339 Encounter for screening examination for other mental health and behavioral disorders: Secondary | ICD-10-CM | POA: Diagnosis not present

## 2024-03-22 DIAGNOSIS — Z1331 Encounter for screening for depression: Secondary | ICD-10-CM | POA: Diagnosis not present

## 2024-03-22 DIAGNOSIS — E039 Hypothyroidism, unspecified: Secondary | ICD-10-CM | POA: Diagnosis not present

## 2024-03-27 DIAGNOSIS — I82409 Acute embolism and thrombosis of unspecified deep veins of unspecified lower extremity: Secondary | ICD-10-CM | POA: Diagnosis not present

## 2024-03-27 DIAGNOSIS — N184 Chronic kidney disease, stage 4 (severe): Secondary | ICD-10-CM | POA: Diagnosis not present

## 2024-03-27 DIAGNOSIS — I4892 Unspecified atrial flutter: Secondary | ICD-10-CM | POA: Diagnosis not present

## 2024-03-27 DIAGNOSIS — R609 Edema, unspecified: Secondary | ICD-10-CM | POA: Diagnosis not present

## 2024-03-27 DIAGNOSIS — I4891 Unspecified atrial fibrillation: Secondary | ICD-10-CM | POA: Diagnosis not present

## 2024-03-27 DIAGNOSIS — F0390 Unspecified dementia without behavioral disturbance: Secondary | ICD-10-CM | POA: Diagnosis not present

## 2024-03-27 DIAGNOSIS — Z299 Encounter for prophylactic measures, unspecified: Secondary | ICD-10-CM | POA: Diagnosis not present

## 2024-03-27 DIAGNOSIS — I10 Essential (primary) hypertension: Secondary | ICD-10-CM | POA: Diagnosis not present
